# Patient Record
Sex: Male | Born: 1945 | Race: Black or African American | Hispanic: No | Marital: Married | Smoking: Former smoker
Health system: Southern US, Community
[De-identification: ages and names within clinical notes are randomized; demographics above are authoritative.]

## PROBLEM LIST (undated history)

## (undated) DIAGNOSIS — C801 Malignant (primary) neoplasm, unspecified: Secondary | ICD-10-CM

## (undated) DIAGNOSIS — C61 Malignant neoplasm of prostate: Secondary | ICD-10-CM

## (undated) DIAGNOSIS — N289 Disorder of kidney and ureter, unspecified: Secondary | ICD-10-CM

## (undated) DIAGNOSIS — I10 Essential (primary) hypertension: Secondary | ICD-10-CM

## (undated) DIAGNOSIS — E119 Type 2 diabetes mellitus without complications: Secondary | ICD-10-CM

## (undated) DIAGNOSIS — E785 Hyperlipidemia, unspecified: Secondary | ICD-10-CM

## (undated) DIAGNOSIS — E041 Nontoxic single thyroid nodule: Secondary | ICD-10-CM

## (undated) DIAGNOSIS — J449 Chronic obstructive pulmonary disease, unspecified: Secondary | ICD-10-CM

## (undated) HISTORY — PX: OTHER SURGICAL HISTORY: SHX169

---

## 2007-08-24 ENCOUNTER — Ambulatory Visit: Payer: Self-pay | Admitting: Gastroenterology

## 2009-09-10 ENCOUNTER — Ambulatory Visit: Payer: Self-pay | Admitting: Radiation Oncology

## 2009-09-22 ENCOUNTER — Ambulatory Visit: Payer: Self-pay | Admitting: General Practice

## 2009-09-28 ENCOUNTER — Ambulatory Visit: Payer: Self-pay | Admitting: Radiation Oncology

## 2009-10-10 ENCOUNTER — Ambulatory Visit: Payer: Self-pay | Admitting: Radiation Oncology

## 2009-11-18 ENCOUNTER — Ambulatory Visit: Payer: Self-pay | Admitting: Radiation Oncology

## 2009-11-27 ENCOUNTER — Ambulatory Visit: Payer: Self-pay | Admitting: Radiation Oncology

## 2009-12-09 ENCOUNTER — Ambulatory Visit: Payer: Self-pay | Admitting: Urology

## 2009-12-10 ENCOUNTER — Ambulatory Visit: Payer: Self-pay | Admitting: Radiation Oncology

## 2010-01-10 ENCOUNTER — Ambulatory Visit: Payer: Self-pay | Admitting: Radiation Oncology

## 2010-05-10 ENCOUNTER — Ambulatory Visit: Payer: Self-pay | Admitting: Radiation Oncology

## 2010-06-11 ENCOUNTER — Ambulatory Visit: Payer: Self-pay | Admitting: Radiation Oncology

## 2010-12-13 ENCOUNTER — Ambulatory Visit: Payer: Self-pay | Admitting: Radiation Oncology

## 2011-01-11 ENCOUNTER — Ambulatory Visit: Payer: Self-pay | Admitting: Radiation Oncology

## 2011-07-20 IMAGING — CT CT ABD-PELV W/ CM
1 of 3 series · 11 of 32 positions shown, 17 images · IV contrast (isovue)
Comparison: None

REASON FOR EXAM: bone scan positive for mets  advanced prostate CA pt
takes Metformin
COMMENTS:

PROCEDURE:     KCT - KCT ABDOMEN/PELVIS W  - September 30, 2009  [DATE]
RESULT:     History: Advanced prostate can't
TECHNIQUE: Multiple axial images of the abdomen and pelvis were performed
from the lung bases to the pubic symphysis, with p.o. contrast and with 100
ml of Isovue 370 intravenous contrast.

[Series 2: abd with 5.0 i40f · axial · 0.91mm/px · z∈[-994,-624]mm · 11 of 90 slices shown, 17 images]
[im 8/90  soft-tissue]
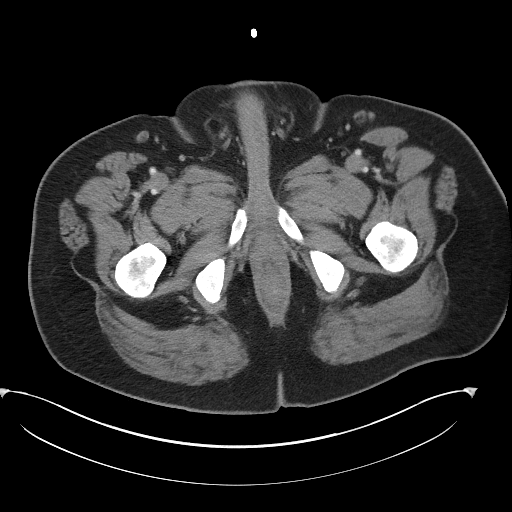
[im 8/90  bone]
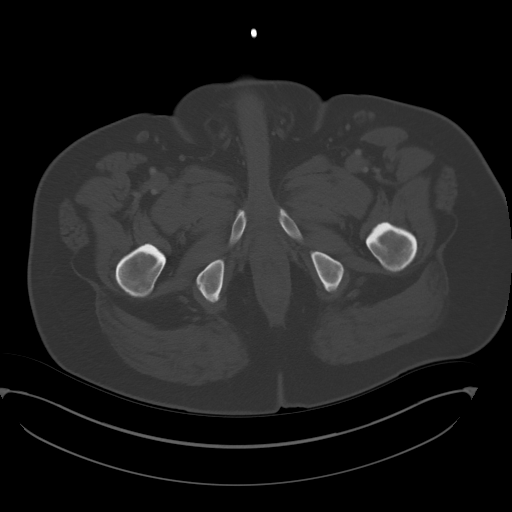
[im 15/90  soft-tissue]
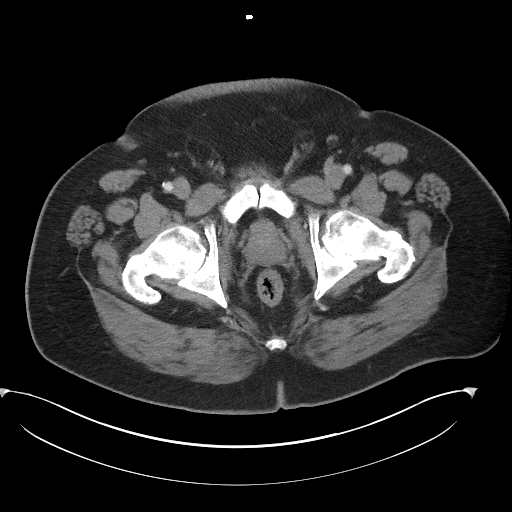
[im 23/90  soft-tissue]
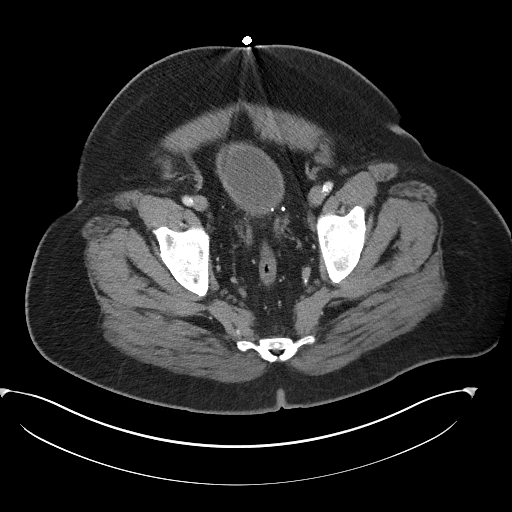
[im 30/90  soft-tissue]
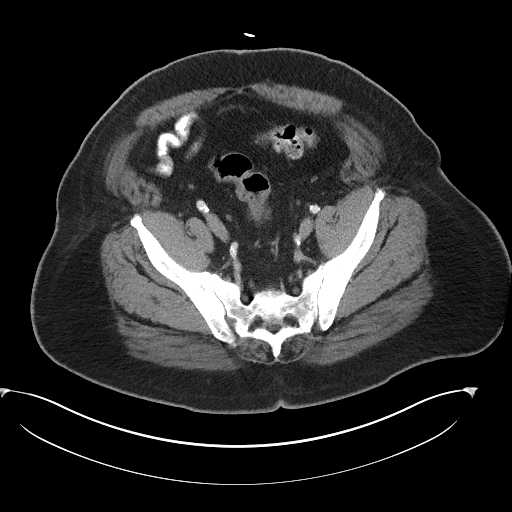
[im 38/90  soft-tissue]
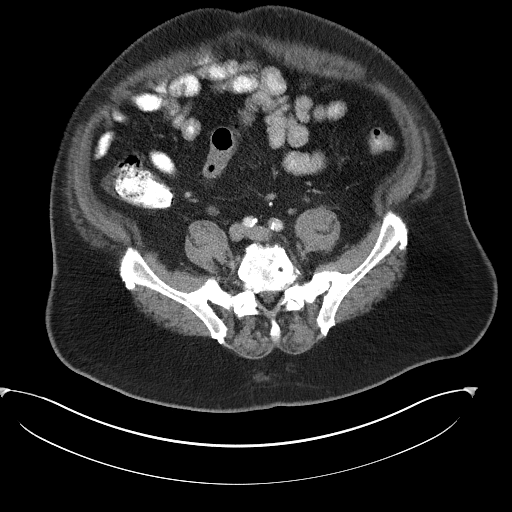
[im 45/90  soft-tissue]
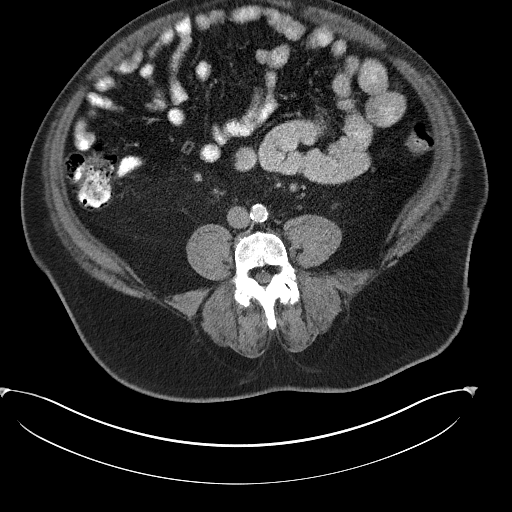
[im 52/90  soft-tissue]
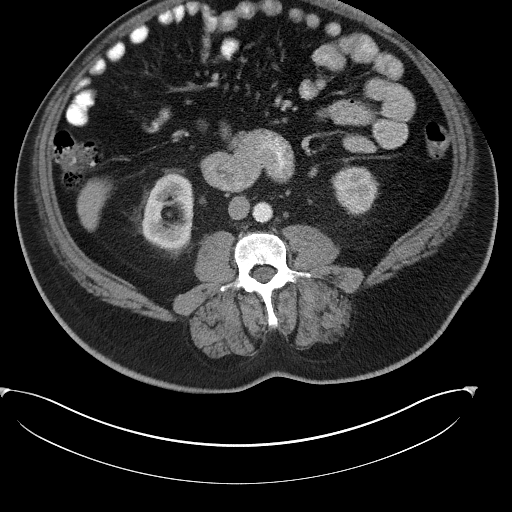
[im 60/90  soft-tissue]
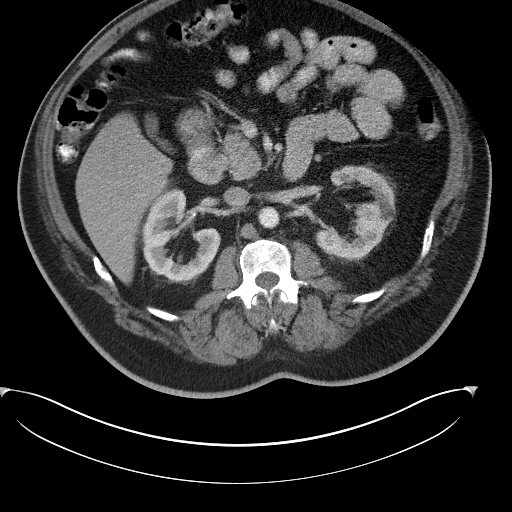
[im 60/90  lung]
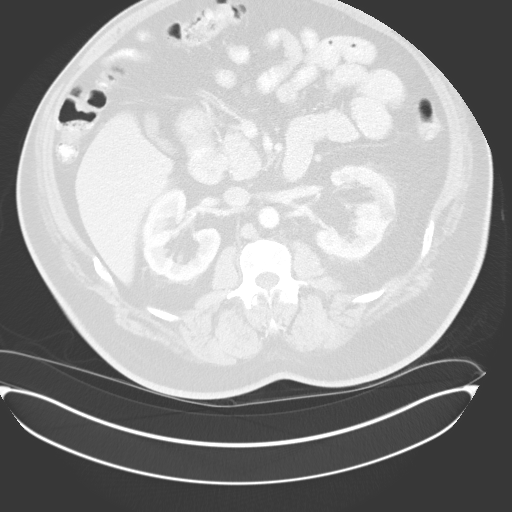
[im 67/90  soft-tissue]
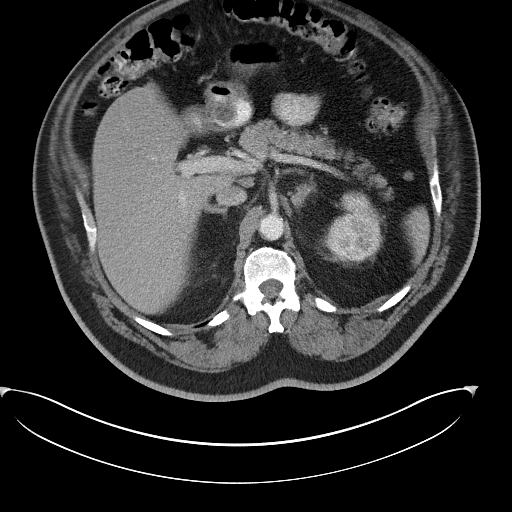
[im 67/90  lung]
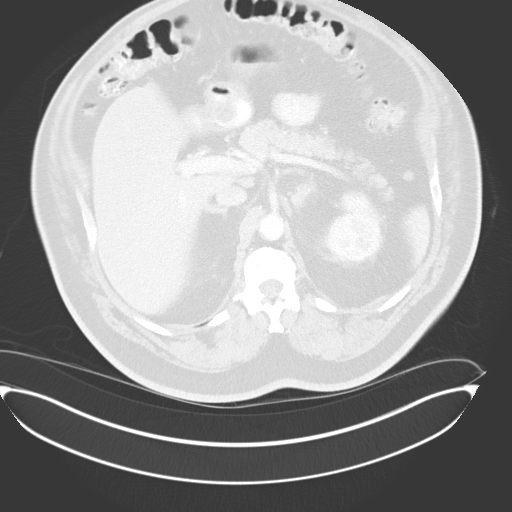
[im 67/90  bone]
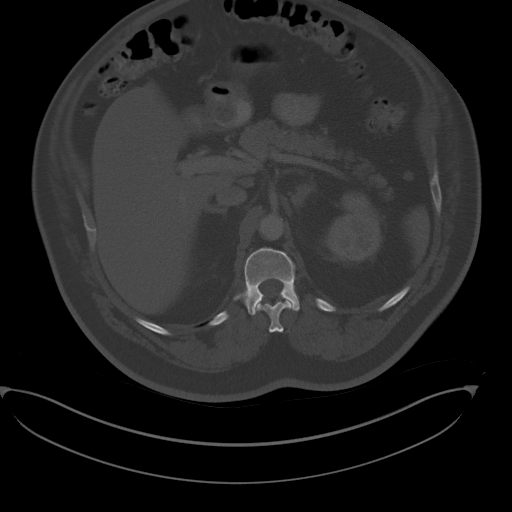
[im 75/90  soft-tissue]
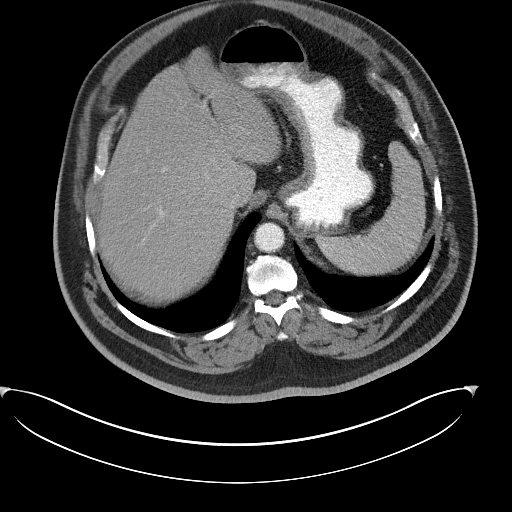
[im 75/90  lung]
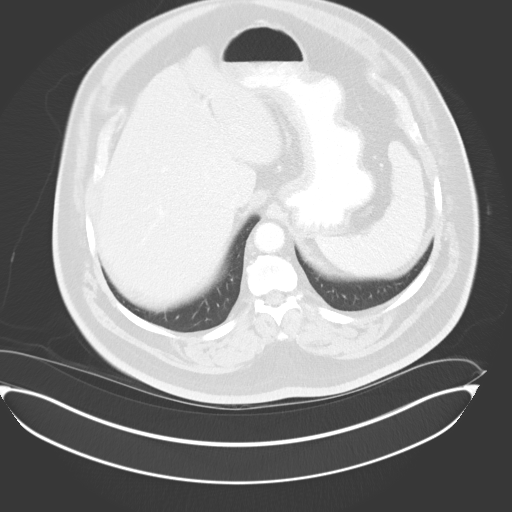
[im 82/90  soft-tissue]
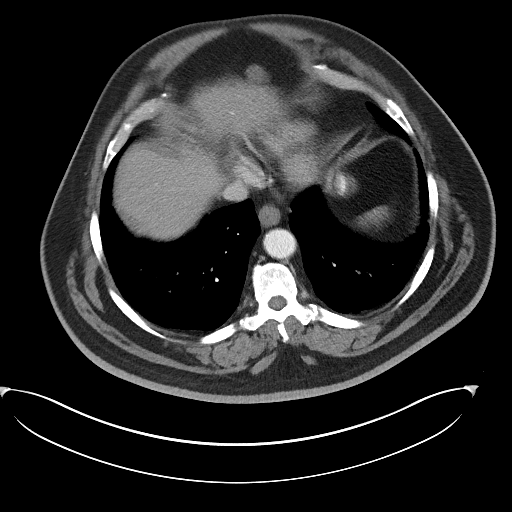
[im 82/90  lung]
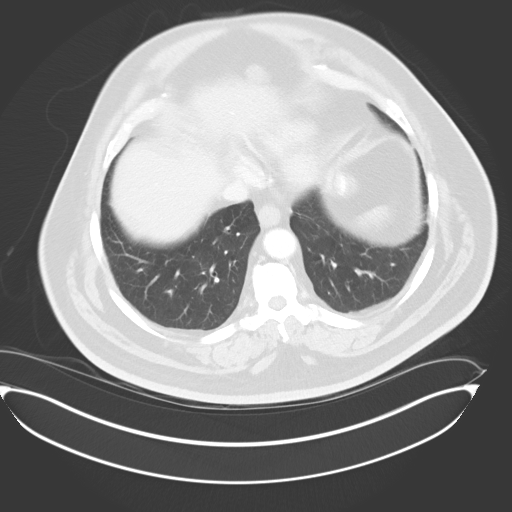

[11 of 32 positions shown; findings below may reference images not displayed]

FINDINGS: The lung bases are clear. There is no pneumothorax. The heart size is
normal.

The liver demonstrates no focal abnormality. There is no intrahepatic or
extrahepatic biliary ductal dilatation. The gallbladder is unremarkable. The
spleen demonstrates no focal abnormality. The kidneys, adrenal glands, and
pancreas are normal. The bladder is unremarkable.

The stomach, duodenum, small intestine, and large intestine demonstrate no
contrast extravasation or dilatation.  There is no pneumoperitoneum,
pneumatosis, or portal venous gas. There is no abdominal or pelvic free
fluid. There are bilateral common iliac artery lymph nodes which are
prominent in size with the largest on the right measuring 1.2 cm and the
largest on the left measuring 10 mm.

There is a heterogeneous appearance of the L5 vertebral body with areas of
lucency which may reflect severe degenerative disc disease and Modic
endplate changes versus metastatic disease. Further evaluation with MRI is
recommended. There is severe degenerative disc disease at L5-S1. There are
degenerative changes of bilateral SI joints.

The osseous structures are unremarkable.
IMPRESSION: There is a heterogeneous appearance of the L5 vertebral body with areas of
lucency which may reflect severe degenerative disc disease and Modic
endplate changes versus metastatic disease. Further evaluation with MRI is
recommended.

## 2011-12-23 ENCOUNTER — Ambulatory Visit: Payer: Self-pay | Admitting: Radiation Oncology

## 2012-01-11 ENCOUNTER — Ambulatory Visit: Payer: Self-pay | Admitting: Radiation Oncology

## 2012-10-01 ENCOUNTER — Ambulatory Visit: Payer: Self-pay | Admitting: Family Medicine

## 2012-11-05 ENCOUNTER — Ambulatory Visit: Payer: Self-pay | Admitting: Gastroenterology

## 2012-12-20 ENCOUNTER — Ambulatory Visit: Payer: Self-pay | Admitting: Radiation Oncology

## 2013-12-11 ENCOUNTER — Ambulatory Visit: Payer: Self-pay | Admitting: Family Medicine

## 2018-01-31 ENCOUNTER — Other Ambulatory Visit: Payer: Self-pay

## 2018-01-31 ENCOUNTER — Emergency Department: Payer: Medicare Other

## 2018-01-31 ENCOUNTER — Emergency Department
Admission: EM | Admit: 2018-01-31 | Discharge: 2018-01-31 | Disposition: A | Discharge status: Home / Self Care | Payer: Medicare Other | Attending: Emergency Medicine | Admitting: Emergency Medicine

## 2018-01-31 DIAGNOSIS — J189 Pneumonia, unspecified organism: Secondary | ICD-10-CM | POA: Diagnosis not present

## 2018-01-31 DIAGNOSIS — Z87891 Personal history of nicotine dependence: Secondary | ICD-10-CM | POA: Insufficient documentation

## 2018-01-31 DIAGNOSIS — Z8546 Personal history of malignant neoplasm of prostate: Secondary | ICD-10-CM | POA: Diagnosis not present

## 2018-01-31 DIAGNOSIS — Z7982 Long term (current) use of aspirin: Secondary | ICD-10-CM | POA: Insufficient documentation

## 2018-01-31 DIAGNOSIS — E119 Type 2 diabetes mellitus without complications: Secondary | ICD-10-CM | POA: Insufficient documentation

## 2018-01-31 DIAGNOSIS — I10 Essential (primary) hypertension: Secondary | ICD-10-CM | POA: Insufficient documentation

## 2018-01-31 DIAGNOSIS — Z79899 Other long term (current) drug therapy: Secondary | ICD-10-CM | POA: Insufficient documentation

## 2018-01-31 DIAGNOSIS — R0602 Shortness of breath: Secondary | ICD-10-CM | POA: Diagnosis present

## 2018-01-31 DIAGNOSIS — R600 Localized edema: Secondary | ICD-10-CM | POA: Diagnosis not present

## 2018-01-31 DIAGNOSIS — Z7984 Long term (current) use of oral hypoglycemic drugs: Secondary | ICD-10-CM | POA: Insufficient documentation

## 2018-01-31 DIAGNOSIS — J441 Chronic obstructive pulmonary disease with (acute) exacerbation: Secondary | ICD-10-CM

## 2018-01-31 HISTORY — DX: Malignant (primary) neoplasm, unspecified: C80.1

## 2018-01-31 HISTORY — DX: Essential (primary) hypertension: I10

## 2018-01-31 HISTORY — DX: Chronic obstructive pulmonary disease, unspecified: J44.9

## 2018-01-31 HISTORY — DX: Malignant neoplasm of prostate: C61

## 2018-01-31 HISTORY — DX: Type 2 diabetes mellitus without complications: E11.9

## 2018-01-31 LAB — BASIC METABOLIC PANEL
Anion gap: 9 (ref 5–15)
BUN: 17 mg/dL (ref 8–23)
CALCIUM: 8.9 mg/dL (ref 8.9–10.3)
CO2: 27 mmol/L (ref 22–32)
Chloride: 99 mmol/L (ref 98–111)
Creatinine, Ser: 1.51 mg/dL — ABNORMAL HIGH (ref 0.61–1.24)
GFR calc Af Amer: 53 mL/min — ABNORMAL LOW (ref >60)
GFR calc non Af Amer: 45 mL/min — ABNORMAL LOW (ref >60)
Glucose, Bld: 171 mg/dL — ABNORMAL HIGH (ref 70–99)
Potassium: 3.9 mmol/L (ref 3.5–5.1)
Sodium: 135 mmol/L (ref 135–145)

## 2018-01-31 LAB — CBC WITH DIFFERENTIAL/PLATELET
Abs Immature Granulocytes: 0.08 10*3/uL — ABNORMAL HIGH (ref 0.00–0.07)
Basophils Absolute: 0.1 10*3/uL (ref 0.0–0.1)
Basophils Relative: 1 %
Eosinophils Absolute: 0.4 10*3/uL (ref 0.0–0.5)
Eosinophils Relative: 3 %
HCT: 41.1 % (ref 39.0–52.0)
Hemoglobin: 12.3 g/dL — ABNORMAL LOW (ref 13.0–17.0)
Immature Granulocytes: 1 %
LYMPHS ABS: 2.1 10*3/uL (ref 0.7–4.0)
Lymphocytes Relative: 15 %
MCH: 25.3 pg — ABNORMAL LOW (ref 26.0–34.0)
MCHC: 29.9 g/dL — ABNORMAL LOW (ref 30.0–36.0)
MCV: 84.4 fL (ref 80.0–100.0)
Monocytes Absolute: 1.5 10*3/uL — ABNORMAL HIGH (ref 0.1–1.0)
Monocytes Relative: 10 %
NRBC: 0 % (ref 0.0–0.2)
Neutro Abs: 9.9 10*3/uL — ABNORMAL HIGH (ref 1.7–7.7)
Neutrophils Relative %: 70 %
Platelets: 272 10*3/uL (ref 150–400)
RBC: 4.87 MIL/uL (ref 4.22–5.81)
RDW: 15 % (ref 11.5–15.5)
WBC: 14 10*3/uL — ABNORMAL HIGH (ref 4.0–10.5)

## 2018-01-31 LAB — BRAIN NATRIURETIC PEPTIDE: B Natriuretic Peptide: 296 pg/mL — ABNORMAL HIGH (ref 0.0–100.0)

## 2018-01-31 LAB — TROPONIN I: Troponin I: <0.03 ng/mL (ref <0.03)

## 2018-01-31 MED ORDER — AZITHROMYCIN 500 MG PO TABS
500.0000 mg | ORAL_TABLET | Freq: Once | ORAL | Status: AC
  Administered 2018-01-31: 500 mg via ORAL
  Filled 2018-01-31: qty 1

## 2018-01-31 MED ORDER — PREDNISONE 20 MG PO TABS: 60.0000 mg | ORAL_TABLET | Freq: Every day | ORAL | 0 refills | Status: AC

## 2018-01-31 MED ORDER — METOPROLOL TARTRATE 50 MG PO TABS
50.0000 mg | ORAL_TABLET | Freq: Once | ORAL | Status: AC
  Administered 2018-01-31: 50 mg via ORAL
  Filled 2018-01-31: qty 1

## 2018-01-31 MED ORDER — IPRATROPIUM-ALBUTEROL 0.5-2.5 (3) MG/3ML IN SOLN
3.0000 mL | Freq: Once | RESPIRATORY_TRACT | Status: AC
  Administered 2018-01-31: 3 mL via RESPIRATORY_TRACT
  Filled 2018-01-31: qty 3

## 2018-01-31 MED ORDER — AZITHROMYCIN 250 MG PO TABS: ORAL_TABLET | ORAL | 0 refills | Status: DC

## 2018-01-31 MED ORDER — SODIUM CHLORIDE 0.9 % IV SOLN
1.0000 g | Freq: Once | INTRAVENOUS | Status: AC
  Administered 2018-01-31: 1 g via INTRAVENOUS
  Filled 2018-01-31: qty 10

## 2018-01-31 MED ORDER — ALBUTEROL SULFATE HFA 108 (90 BASE) MCG/ACT IN AERS: 2.0000 | INHALATION_SPRAY | Freq: Four times a day (QID) | RESPIRATORY_TRACT | 2 refills | Status: AC | PRN

## 2018-01-31 MED ORDER — CEFPODOXIME PROXETIL 200 MG PO TABS: 200.0000 mg | ORAL_TABLET | Freq: Two times a day (BID) | ORAL | 0 refills | Status: AC

## 2018-01-31 NOTE — ED Triage Notes (Signed)
As per ems patient called from side of road with c/o of sob. Patient with auditory wheezing upon ed arrival. Sating 70% upon ems arrival. Duo nebs x 2  And solumedrol 125 mg IVP given prior to ed arrival.

## 2018-01-31 NOTE — ED Provider Notes (Signed)
[facility] Emergency Department Provider Note  ____________________________________________  Time seen: Approximately 4:31 PM  I have reviewed the triage vital signs and the nursing notes.   HISTORY  Chief Complaint Shortness of Breath   HPI [name] is a 73 y.o. male history of remote prostate cancer, COPD, diabetes, hypertension who presents for evaluation of shortness of breath.  Patient reports worsening productive cough over the last week.  Cough is productive of cloudy white sputum.  Today his shortness of breath became worse.  While patient was driving his shortness of breath became severe.  He was wheezing.  He denies chest pain, fever or chills, hemoptysis, personal or family history of blood clots, recent travel immobilization, leg pain, or exogenous hormones.  He has had no vomiting or diarrhea.  Patient was found per EMS to be hypoxic with oxygen in the 70s.  After 2 DuoNeb's and Solu-Medrol patient arrives with no hypoxia and significantly provement on his work of breathing.   Past Medical History:  Diagnosis Date  . Cancer ([employer])   . COPD (chronic obstructive pulmonary disease) ([place])   . Diabetes mellitus without complication ([place])   . Hypertension   . Prostate CA ([place])     There are no active problems to display for this patient.   History reviewed. No pertinent surgical history.  Prior to Admission medications   Medication Sig Start Date End Date Taking? Authorizing Provider  amLODipine (NORVASC) 5 MG tablet Take 5 mg by mouth daily. 01/26/15  Yes [provider]  aspirin EC 81 MG tablet Take 81 mg by mouth daily.   Yes [provider]  atorvastatin (LIPITOR) 40 MG tablet Take 40 mg by mouth daily. 10/25/13 08/10/18 Yes [provider]  losartan (COZAAR) 100 MG tablet Take 100 mg by mouth daily. 08/10/17  Yes [provider]  metFORMIN (GLUCOPHAGE) 850 MG tablet Take 850 mg by mouth 2 (two) times  daily. 04/08/14  Yes [provider]  metoprolol tartrate (LOPRESSOR) 100 MG tablet Take 50 mg by mouth 2 (two) times daily. 01/26/15  Yes [provider]  sertraline (ZOLOFT) 100 MG tablet Take 100 mg by mouth daily. 12/13/17  Yes [provider]  sertraline (ZOLOFT) 50 MG tablet Take 50 mg by mouth daily. 10/04/16  Yes [provider]  tadalafil (CIALIS) 20 MG tablet Take 20 mg by mouth as needed. 05/08/15  Yes [provider]  traZODone (DESYREL) 100 MG tablet Take 100 mg by mouth at bedtime as needed for sleep. 12/13/17  Yes [provider]  albuterol (PROVENTIL HFA;VENTOLIN HFA) 108 (90 Base) MCG/ACT inhaler Inhale 2 puffs into the lungs every 6 (six) hours as needed for wheezing or shortness of breath. 01/31/18   Alfred Levins, [place], MD  azithromycin [facility]) 250 MG tablet Take 1 a day for 4 days 01/31/18   Alfred Levins, [place], MD  cefpodoxime (VANTIN) 200 MG tablet Take 1 tablet (200 mg total) by mouth 2 (two) times daily for 7 days. 01/31/18 02/07/18  Rudene Re, MD  predniSONE (DELTASONE) 20 MG tablet Take 3 tablets (60 mg total) by mouth daily for 4 days. 01/31/18 02/04/18  Rudene Re, MD  sildenafil (REVATIO) 20 MG tablet Take 100 mg by mouth as needed. 12/13/17   [provider]    Allergies Penicillins  History reviewed. No pertinent family history.  Social History Social History   Tobacco Use  . Smoking status: Former Smoker    Last attempt to quit: 02/01/1999  Years since quitting: 19.0  . Smokeless tobacco: Current User  Substance Use Topics  . Alcohol use: Not on file  . Drug use: Not on file    Review of Systems  Constitutional: Negative for fever. Eyes: Negative for visual changes. ENT: Negative for sore throat. Neck: No neck pain  Cardiovascular: Negative for chest pain. Respiratory: + shortness of breath and cough Gastrointestinal: Negative for abdominal pain, vomiting or  diarrhea. Genitourinary: Negative for dysuria. Musculoskeletal: Negative for back pain. Skin: Negative for rash. Neurological: Negative for headaches, weakness or numbness. Psych: No SI or HI  ____________________________________________   PHYSICAL EXAM:  VITAL SIGNS: ED Triage Vitals  Enc Vitals Group     BP 01/31/18 1622 (!) 170/115     Pulse Rate 01/31/18 1622 93     Resp --      Temp --      Temp Source 01/31/18 1622 Oral     SpO2 01/31/18 1622 100 %     Weight 01/31/18 1623 240 lb (108.9 kg)     Height 01/31/18 1623 5\' 6"  (1.676 m)     Head Circumference --      Peak Flow --      Pain Score 01/31/18 1622 0     Pain Loc --      Pain Edu? --      Excl. in [place]? --     Constitutional: Alert and oriented. Well appearing and in no apparent distress. HEENT:      Head: Normocephalic and atraumatic.         Eyes: Conjunctivae are normal. Sclera is non-icteric.       Mouth/Throat: Mucous membranes are moist.       Neck: Supple with no signs of meningismus. Cardiovascular: Regular rate and rhythm. No murmurs, gallops, or rubs. 2+ symmetrical distal pulses are present in all extremities. No JVD. Respiratory: Mild increased work of breathing, diffuse coarse rhonchi bilaterally and expiratory wheezes  Gastrointestinal: Soft, non tender, and non distended with positive bowel sounds. No rebound or guarding. Musculoskeletal: Trace edema on the RLE and no pitting edema on the LLE Neurologic: Normal speech and language. Face is symmetric. Moving all extremities. No gross focal neurologic deficits are appreciated. Skin: Skin is warm, dry and intact. No rash noted. Psychiatric: Mood and affect are normal. Speech and behavior are normal.  ____________________________________________   LABS (all labs ordered are listed, but only abnormal results are displayed)  Labs Reviewed  CBC WITH DIFFERENTIAL/PLATELET - Abnormal; Notable for the following components:      Result Value   WBC  14.0 (*)    Hemoglobin 12.3 (*)    MCH 25.3 (*)    MCHC 29.9 (*)    Neutro Abs 9.9 (*)    Monocytes Absolute 1.5 (*)    Abs Immature Granulocytes 0.08 (*)    All other components within normal limits  BASIC METABOLIC PANEL - Abnormal; Notable for the following components:   Glucose, Bld 171 (*)    Creatinine, Ser 1.51 (*)    GFR calc non Af Amer 45 (*)    GFR calc Af Amer 53 (*)    All other components within normal limits  BRAIN NATRIURETIC PEPTIDE - Abnormal; Notable for the following components:   B Natriuretic Peptide 296.0 (*)    All other components within normal limits  TROPONIN I   ____________________________________________  EKG  ED ECG REPORT I, Rudene Re, the attending physician, personally viewed and interpreted this ECG.  Normal  sinus rhythm, rate of 93, normal intervals, normal axis, no ST elevations or depressions.  Normal EKG. ____________________________________________  RADIOLOGY  I have personally reviewed the images performed during this visit and I agree with the Radiologist's read.   Interpretation by Radiologist:  Dg Chest 2 View  Result Date: 01/31/2018 CLINICAL DATA:  Cough and shortness of breath. Wheezing. EXAM: CHEST - 2 VIEW COMPARISON:  None. FINDINGS: Low lung volumes. Mild bibasilar atelectasis. Airspace opacity is seen in the inferior right upper lobe, suspicious for pneumonia. No evidence of pleural effusion. IMPRESSION: 1. Right upper lobe airspace opacity, suspicious for pneumonia. Recommend clinical correlation, and followup PA and lateral chest X-ray in several weeks to ensure resolution. 2. Low lung volumes with mild bibasilar atelectasis. Electronically Signed   By: Earle Gell M.D.   On: 01/31/2018 18:19   US Venous Img Lower Bilateral  Result Date: 01/31/2018 CLINICAL DATA:  Asymmetric leg swelling EXAM: BILATERAL LOWER EXTREMITY VENOUS DOPPLER ULTRASOUND TECHNIQUE: Gray-scale sonography with graded compression, as well as  color Doppler and duplex ultrasound were performed to evaluate the lower extremity deep venous systems from the level of the common femoral vein and including the common femoral, femoral, profunda femoral, popliteal and calf veins including the posterior tibial, peroneal and gastrocnemius veins when visible. The superficial great saphenous vein was also interrogated. Spectral Doppler was utilized to evaluate flow at rest and with distal augmentation maneuvers in the common femoral, femoral and popliteal veins. COMPARISON:  None. FINDINGS: RIGHT LOWER EXTREMITY Common Femoral Vein: No evidence of thrombus. Normal compressibility, respiratory phasicity and response to augmentation. Saphenofemoral Junction: No evidence of thrombus. Normal compressibility and flow on color Doppler imaging. Profunda Femoral Vein: No evidence of thrombus. Normal compressibility and flow on color Doppler imaging. Femoral Vein: No evidence of thrombus. Normal compressibility, respiratory phasicity and response to augmentation. Popliteal Vein: No evidence of thrombus. Normal compressibility, respiratory phasicity and response to augmentation. Calf Veins: Limited assessment of the calf veins. Posterior tibial vein appears patent and compressible. Peroneal vein not visualized. Superficial Great Saphenous Vein: No evidence of thrombus. Normal compressibility. Venous Reflux:  None. Other Findings:  None. LEFT LOWER EXTREMITY Common Femoral Vein: No evidence of thrombus. Normal compressibility, respiratory phasicity and response to augmentation. Saphenofemoral Junction: No evidence of thrombus. Normal compressibility and flow on color Doppler imaging. Profunda Femoral Vein: No evidence of thrombus. Normal compressibility and flow on color Doppler imaging. Femoral Vein: No evidence of thrombus. Normal compressibility, respiratory phasicity and response to augmentation. Popliteal Vein: No evidence of thrombus. Normal compressibility, respiratory  phasicity and response to augmentation. Calf Veins: Similar limited assessment of the calf veins. Posterior tibial vein appears patent and compressible. Peroneal vein not visualized. Superficial Great Saphenous Vein: No evidence of thrombus. Normal compressibility. Venous Reflux:  None. Other Findings:  None. IMPRESSION: No evidence of significant deep venous thrombosis in either extremity. Limited assessment of the calf veins as above. Electronically Signed   By: Jerilynn Mages.  Shick M.D.   On: 01/31/2018 18:14      ____________________________________________   PROCEDURES  Procedure(s) performed: None Procedures Critical Care performed:  None ____________________________________________   INITIAL IMPRESSION / ASSESSMENT AND PLAN / ED COURSE  73 y.o. male history of remote prostate cancer, COPD, diabetes, hypertension who presents for evaluation of productive cough and progressively worsening shortness of breath x 1 week which became severe while driving prior to arrival. Patient hypoxic to EMS.  After 2 DuoNeb's in route patient arrives with mildly increased work of breathing but  normal sats, no oxygen requirement, still having diffuse coarse rhonchi and wheezing bilaterally.  Patient has slightly asymmetric lower extremity with right bigger than left.  Patient reports never noticing this before.  He has never had a history of blood clots however we will send patient for ultrasound Doppler to rule out a DVT.  We will do a chest x-ray to rule out pulmonary edema or pneumonia.  Will do duo nebs for COPD exacerbation.  Patient has received Solu-Medrol per EMS.  Will check labs, EKG.    _________________________ 7:22 PM on 01/31/2018 -----------------------------------------  Chest x-ray concerning for pneumonia.  Patient with a white count of 14 but no other signs of sepsis.  Doppler studies negative for DVT.  After 3 DuoNeb's and steroids patient is moving good air with resolution of coarse rhonchi and  wheezing.  Patient was able to ambulate and maintain his sats at 95% on room air.  Discussed admission for IV antibiotics and observation for 24 hours but patient prefers to go home.  At this time I believe it is reasonable for him to go home.  He received a dose of IV Rocephin and p.o. azithromycin.  We will send him home on Cefpodoxime and azithromycin, prednisone, and prescription for albuterol inhaler.  Discussed close follow-up with primary care doctor and standard return precautions with patient and his wife.   As part of my medical decision making, I reviewed the following data within the [place] notes reviewed and incorporated, Labs reviewed , EKG interpreted , Old EKG reviewed, Old chart reviewed, Radiograph reviewed , Notes from prior ED visits and [place] Controlled Substance Database    Pertinent labs & imaging results that were available during my care of the patient were reviewed by me and considered in my medical decision making (see chart for details).    ____________________________________________   FINAL CLINICAL IMPRESSION(S) / ED DIAGNOSES  Final diagnoses:  COPD exacerbation ([place])  Community acquired pneumonia, unspecified laterality      NEW MEDICATIONS STARTED DURING THIS VISIT:  ED Discharge Orders         Ordered    cefpodoxime (VANTIN) 200 MG tablet  2 times daily     01/31/18 1921    azithromycin (ZITHROMAX) 250 MG tablet     01/31/18 1921    predniSONE (DELTASONE) 20 MG tablet  Daily     01/31/18 1921    albuterol (PROVENTIL HFA;VENTOLIN HFA) 108 (90 Base) MCG/ACT inhaler  Every 6 hours PRN     01/31/18 1921           Note:  This document was prepared using Dragon voice recognition software and may include unintentional dictation errors.    Rudene Re, MD 01/31/18 [phone]

## 2018-01-31 NOTE — Discharge Instructions (Addendum)

## 2018-01-31 NOTE — ED Notes (Signed)
Patient ambulated with without 02, desated down to 94%-95% on room air.

## 2018-04-05 ENCOUNTER — Encounter: Admission: RE | Payer: Self-pay | Source: Home / Self Care

## 2018-04-05 ENCOUNTER — Ambulatory Visit: Admission: RE | Admit: 2018-04-05 | Payer: Medicare Other | Source: Home / Self Care | Admitting: Gastroenterology

## 2018-04-05 SURGERY — COLONOSCOPY WITH PROPOFOL
Anesthesia: General

## 2018-05-08 ENCOUNTER — Other Ambulatory Visit: Payer: Self-pay

## 2018-05-08 ENCOUNTER — Telehealth: Payer: Medicare Other | Admitting: Urology

## 2018-06-20 ENCOUNTER — Other Ambulatory Visit: Payer: Self-pay

## 2018-06-20 ENCOUNTER — Encounter: Payer: Self-pay | Admitting: Urology

## 2018-06-20 ENCOUNTER — Ambulatory Visit (INDEPENDENT_AMBULATORY_CARE_PROVIDER_SITE_OTHER): Payer: Medicare Other | Admitting: Urology

## 2018-06-20 VITALS — BP 162/98 | HR 98 | Ht 66.0 in | Wt 265.0 lb

## 2018-06-20 DIAGNOSIS — C61 Malignant neoplasm of prostate: Secondary | ICD-10-CM | POA: Diagnosis not present

## 2018-06-20 DIAGNOSIS — N5203 Combined arterial insufficiency and corporo-venous occlusive erectile dysfunction: Secondary | ICD-10-CM

## 2018-06-20 DIAGNOSIS — R972 Elevated prostate specific antigen [PSA]: Secondary | ICD-10-CM | POA: Diagnosis not present

## 2018-06-20 NOTE — Progress Notes (Signed)
06/20/2018 10:02 AM   Cherlynn June 1945/07/26 [id number]  Referring provider: Juluis Pitch, MD [phone], [place] [address]  Chief Complaint  Patient presents with  . Elevated PSA    HPI: 73 year old male with a personal history of prostate cancer who presents today for rising PSA.  He was referred by his primary care physician.  Notably, the patient was diagnosed with prostate cancer, Gleason 4+3, T2 in 2011.  He was treated with brachytherapy in November 2011.  His nadir appears to be 0.11.  He also has a history of microscopic hematuria.  He was seen and evaluated in 2017 by Dr. Bernardo Heater.  Renal ultrasound showed bilateral renal cysts but was otherwise unremarkable.  Most recent UA on 06/2017 negative for RBC.    He also has ED and uses sidliafil as needed.  No side effects.    He denies any significant urinary issues.    His PSA trend is as below.  PSA: 0.11 04/2014 0.28 03/2015  0.51 05/2016 0.96 07/2017 1.48 01/2018   PMH: Past Medical History:  Diagnosis Date  . Cancer ([place])   . COPD (chronic obstructive pulmonary disease) ([place])   . Diabetes mellitus without complication ([place])   . Hypertension   . Prostate CA [facility])     Surgical History: No past surgical history on file.  Home Medications:  Allergies as of 06/20/2018      Reactions   Penicillins Swelling   Eggs Or Egg-derived Products Other (See Comments)      Medication List       Accurate as of June 20, 2018 10:02 AM. If you have any questions, ask your nurse or doctor.        STOP taking these medications   azithromycin 250 MG tablet Commonly known as:  ZITHROMAX Stopped by:  [name] Espy, MD     TAKE these medications   albuterol 108 (90 Base) MCG/ACT inhaler Commonly known as:  VENTOLIN HFA Inhale 2 puffs into the lungs every 6 (six) hours as needed for wheezing or shortness of breath.   amLODipine 5 MG tablet Commonly known as:  NORVASC Take 5 mg by mouth daily.    aspirin EC 81 MG tablet Take 81 mg by mouth daily.   atorvastatin 40 MG tablet Commonly known as:  LIPITOR Take 40 mg by mouth daily.   Cialis 20 MG tablet Generic drug:  tadalafil Take 20 mg by mouth as needed.   losartan 100 MG tablet Commonly known as:  COZAAR Take 100 mg by mouth daily.   metFORMIN 850 MG tablet Commonly known as:  GLUCOPHAGE Take 850 mg by mouth 2 (two) times daily.   metoprolol tartrate 100 MG tablet Commonly known as:  LOPRESSOR Take 50 mg by mouth 2 (two) times daily.   sertraline 50 MG tablet Commonly known as:  ZOLOFT Take 50 mg by mouth daily.   sertraline 100 MG tablet Commonly known as:  ZOLOFT Take 100 mg by mouth daily.   sildenafil 20 MG tablet Commonly known as:  REVATIO Take 100 mg by mouth as needed.   traZODone 100 MG tablet Commonly known as:  DESYREL Take 100 mg by mouth at bedtime as needed for sleep.       Allergies:  Allergies  Allergen Reactions  . Penicillins Swelling  . Eggs Or Egg-Derived Products Other (See Comments)    Family History: No family history on file.  Social History:  reports that he quit smoking about 43  years ago. He uses smokeless tobacco. No history on file for alcohol and drug.  ROS: UROLOGY Frequent Urination?: No Hard to postpone urination?: No Burning/pain with urination?: No Get up at night to urinate?: No Leakage of urine?: No Urine stream starts and stops?: No Trouble starting stream?: No Do you have to strain to urinate?: No Blood in urine?: No Urinary tract infection?: No Sexually transmitted disease?: No Injury to kidneys or bladder?: No Painful intercourse?: No Weak stream?: No Erection problems?: No Penile pain?: No  Gastrointestinal Nausea?: No Vomiting?: No Indigestion/heartburn?: No Diarrhea?: No Constipation?: No  Constitutional Fever: No Night sweats?: No Weight loss?: No Fatigue?: No  Skin Skin rash/lesions?: No Itching?: No  Eyes Blurred  vision?: No Double vision?: No  Ears/Nose/Throat Sore throat?: No Sinus problems?: No  Hematologic/Lymphatic Swollen glands?: No Easy bruising?: No  Cardiovascular Leg swelling?: No Chest pain?: No  Respiratory Cough?: No Shortness of breath?: No  Endocrine Excessive thirst?: No  Musculoskeletal Back pain?: No Joint pain?: No  Neurological Headaches?: No Dizziness?: No  Psychologic Depression?: No Anxiety?: No  Physical Exam: BP (!) 162/98   Pulse 98   Ht 5\' 6"  (1.676 m)   Wt 265 lb (120.2 kg)   BMI 42.77 kg/m   Constitutional:  Alert and oriented, No acute distress. HEENT: [place] AT, moist mucus membranes.  Trachea midline, no masses. Cardiovascular: No clubbing, cyanosis, or edema. Respiratory: Normal respiratory effort, no increased work of breathing. GI: Abdomen is soft, obese Skin: No rashes, bruises or suspicious lesions. Neurologic: Grossly intact, no focal deficits, moving all 4 extremities. Psychiatric: Normal mood and affect.  Laboratory Data: Lab Results  Component Value Date   WBC 14.0 (H) 01/31/2018   HGB 12.3 (L) 01/31/2018   HCT 41.1 01/31/2018   MCV 84.4 01/31/2018   PLT 272 01/31/2018    Lab Results  Component Value Date   CREATININE 1.51 (H) 01/31/2018   PSA as above  Urinalysis N/a  Pertinent Imaging: n/a  Assessment & Plan:    1. Prostate cancer [facility]) Personal history of unfavorable intermediate risk prostate cancer status post seed Implantation in 2011 PSA has been slowly rising We discussed that criteria for biochemical recurrence with radiation is when the PSA rises 2 ng/mL above the nadir, has yet to reach this criteria Plan for repeat PSA today If his PSA is above 2.11, will plan for restaging imaging and discussion of various treatment options If his PSA is below this threshold, we will plan to follow him very closely with PSA prior He is otherwise asymptomatic He is agreeable the above plan - PSA - PSA; Future   2. 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  [name] Espy, MD  [facility] [address] [place], [place] [address] [phone]

## 2018-06-21 ENCOUNTER — Telehealth: Payer: Self-pay

## 2018-06-21 LAB — PSA: Prostate Specific Ag, Serum: 1.4 ng/mL (ref 0.0–4.0)

## 2018-06-21 NOTE — Telephone Encounter (Signed)
-----   Message from [name] Espy, MD sent at 06/21/2018  8:21 AM EDT ----- PSA is stable at 1.4.  Lets follow up in 6 months with PSA prior as discussed.    [name] Espy, MD

## 2018-06-21 NOTE — Telephone Encounter (Signed)
Patient notified

## 2018-08-13 ENCOUNTER — Other Ambulatory Visit: Payer: Self-pay

## 2018-08-13 ENCOUNTER — Other Ambulatory Visit
Admission: RE | Admit: 2018-08-13 | Discharge: 2018-08-13 | Disposition: A | Discharge status: Home / Self Care | Payer: Medicare Other | Source: Ambulatory Visit | Attending: Gastroenterology | Admitting: Gastroenterology

## 2018-08-13 DIAGNOSIS — Z20828 Contact with and (suspected) exposure to other viral communicable diseases: Secondary | ICD-10-CM | POA: Insufficient documentation

## 2018-08-13 DIAGNOSIS — Z01812 Encounter for preprocedural laboratory examination: Secondary | ICD-10-CM | POA: Insufficient documentation

## 2018-08-13 LAB — SARS CORONAVIRUS 2 (TAT 6-24 HRS): SARS Coronavirus 2: NEGATIVE

## 2018-08-15 ENCOUNTER — Encounter: Payer: Self-pay | Admitting: *Deleted

## 2018-08-16 ENCOUNTER — Encounter: Payer: Self-pay | Admitting: *Deleted

## 2018-08-16 ENCOUNTER — Ambulatory Visit: Payer: Medicare Other | Admitting: Anesthesiology

## 2018-08-16 ENCOUNTER — Other Ambulatory Visit: Payer: Self-pay

## 2018-08-16 ENCOUNTER — Encounter
Admission: RE | Disposition: A | Discharge status: Home / Self Care | Payer: Self-pay | Source: Home / Self Care | Attending: Gastroenterology

## 2018-08-16 ENCOUNTER — Ambulatory Visit
Admission: RE | Admit: 2018-08-16 | Discharge: 2018-08-16 | Disposition: A | Discharge status: Home / Self Care | Payer: Medicare Other | Attending: Gastroenterology | Admitting: Gastroenterology

## 2018-08-16 DIAGNOSIS — K640 First degree hemorrhoids: Secondary | ICD-10-CM | POA: Insufficient documentation

## 2018-08-16 DIAGNOSIS — D124 Benign neoplasm of descending colon: Secondary | ICD-10-CM | POA: Insufficient documentation

## 2018-08-16 DIAGNOSIS — E119 Type 2 diabetes mellitus without complications: Secondary | ICD-10-CM | POA: Diagnosis not present

## 2018-08-16 DIAGNOSIS — Z86010 Personal history of colonic polyps: Secondary | ICD-10-CM | POA: Diagnosis not present

## 2018-08-16 DIAGNOSIS — Z7982 Long term (current) use of aspirin: Secondary | ICD-10-CM | POA: Insufficient documentation

## 2018-08-16 DIAGNOSIS — J449 Chronic obstructive pulmonary disease, unspecified: Secondary | ICD-10-CM | POA: Diagnosis not present

## 2018-08-16 DIAGNOSIS — Z8546 Personal history of malignant neoplasm of prostate: Secondary | ICD-10-CM | POA: Diagnosis not present

## 2018-08-16 DIAGNOSIS — K5730 Diverticulosis of large intestine without perforation or abscess without bleeding: Secondary | ICD-10-CM | POA: Insufficient documentation

## 2018-08-16 DIAGNOSIS — N289 Disorder of kidney and ureter, unspecified: Secondary | ICD-10-CM | POA: Diagnosis not present

## 2018-08-16 DIAGNOSIS — I10 Essential (primary) hypertension: Secondary | ICD-10-CM | POA: Insufficient documentation

## 2018-08-16 DIAGNOSIS — Z7984 Long term (current) use of oral hypoglycemic drugs: Secondary | ICD-10-CM | POA: Insufficient documentation

## 2018-08-16 DIAGNOSIS — Z79899 Other long term (current) drug therapy: Secondary | ICD-10-CM | POA: Diagnosis not present

## 2018-08-16 DIAGNOSIS — Z87891 Personal history of nicotine dependence: Secondary | ICD-10-CM | POA: Diagnosis not present

## 2018-08-16 DIAGNOSIS — E785 Hyperlipidemia, unspecified: Secondary | ICD-10-CM | POA: Insufficient documentation

## 2018-08-16 DIAGNOSIS — D128 Benign neoplasm of rectum: Secondary | ICD-10-CM | POA: Diagnosis not present

## 2018-08-16 HISTORY — PX: COLONOSCOPY WITH PROPOFOL: SHX5780

## 2018-08-16 HISTORY — DX: Nontoxic single thyroid nodule: E04.1

## 2018-08-16 HISTORY — DX: Hyperlipidemia, unspecified: E78.5

## 2018-08-16 HISTORY — DX: Disorder of kidney and ureter, unspecified: N28.9

## 2018-08-16 LAB — GLUCOSE, CAPILLARY: Glucose-Capillary: 143 mg/dL — ABNORMAL HIGH (ref 70–99)

## 2018-08-16 SURGERY — COLONOSCOPY WITH PROPOFOL
Anesthesia: General

## 2018-08-16 MED ORDER — SODIUM CHLORIDE 0.9 % IV SOLN
INTRAVENOUS | Status: DC
  Administered 2018-08-16 (×2): via INTRAVENOUS

## 2018-08-16 MED ORDER — PROPOFOL 500 MG/50ML IV EMUL
INTRAVENOUS | Status: DC | PRN
  Administered 2018-08-16: 150 ug/kg/min via INTRAVENOUS

## 2018-08-16 MED ORDER — EPHEDRINE SULFATE 50 MG/ML IJ SOLN
INTRAMUSCULAR | Status: AC
  Filled 2018-08-16: qty 1

## 2018-08-16 MED ORDER — LIDOCAINE HCL (PF) 2 % IJ SOLN
INTRAMUSCULAR | Status: AC
  Filled 2018-08-16: qty 10

## 2018-08-16 MED ORDER — PROPOFOL 500 MG/50ML IV EMUL
INTRAVENOUS | Status: AC
  Filled 2018-08-16: qty 50

## 2018-08-16 MED ORDER — FENTANYL CITRATE (PF) 100 MCG/2ML IJ SOLN
INTRAMUSCULAR | Status: AC
  Filled 2018-08-16: qty 2

## 2018-08-16 MED ORDER — PROPOFOL 10 MG/ML IV BOLUS
INTRAVENOUS | Status: DC | PRN
  Administered 2018-08-16: 40 mg via INTRAVENOUS

## 2018-08-16 MED ORDER — FENTANYL CITRATE (PF) 100 MCG/2ML IJ SOLN
INTRAMUSCULAR | Status: DC | PRN
  Administered 2018-08-16: 50 ug via INTRAVENOUS

## 2018-08-16 NOTE — Transfer of Care (Signed)
Immediate Anesthesia Transfer of Care Note  Patient: TYGE SOMERS  Procedure(s) Performed: COLONOSCOPY WITH PROPOFOL (N/A )  Patient Location: PACU  Anesthesia Type:General  Level of Consciousness: sedated  Airway & Oxygen Therapy: Patient Spontanous Breathing and Patient connected to nasal cannula oxygen  Post-op Assessment: Report given to RN and Post -op Vital signs reviewed and stable  Post vital signs: Reviewed and stable  Last Vitals:  Vitals Value Taken Time  BP 128/73 08/16/18 0938  Temp 36.1 C 08/16/18 0936  Pulse 73 08/16/18 0938  Resp 18 08/16/18 0938  SpO2 93 % 08/16/18 0938  Vitals shown include unvalidated device data.  Last Pain:  Vitals:   08/16/18 0936  TempSrc: Tympanic  PainSc: Asleep         Complications: No apparent anesthesia complications

## 2018-08-16 NOTE — Anesthesia Preprocedure Evaluation (Addendum)
Anesthesia Evaluation  Patient identified by MRN, date of birth, ID band Patient awake    Reviewed: Allergy & Precautions, NPO status , Patient's Chart, lab work & pertinent test results  Airway Mallampati: III  TM Distance: >3 FB     Dental  (+) Upper Dentures, Lower Dentures   Pulmonary COPD, former smoker,    Pulmonary exam normal        Cardiovascular hypertension, Normal cardiovascular exam     Neuro/Psych negative neurological ROS  negative psych ROS   GI/Hepatic   Endo/Other  diabetes  Renal/GU Renal InsufficiencyRenal disease  negative genitourinary   Musculoskeletal negative musculoskeletal ROS (+)   Abdominal Normal abdominal exam  (+)   Peds negative pediatric ROS (+)  Hematology negative hematology ROS (+)   Anesthesia Other Findings   Reproductive/Obstetrics                             Anesthesia Physical Anesthesia Plan  ASA: III  Anesthesia Plan: General   Post-op Pain Management:    Induction: Intravenous  PONV Risk Score and Plan:   Airway Management Planned: Nasal Cannula  Additional Equipment:   Intra-op Plan:   Post-operative Plan:   Informed Consent: I have reviewed the patients History and Physical, chart, labs and discussed the procedure including the risks, benefits and alternatives for the proposed anesthesia with the patient or authorized representative who has indicated his/her understanding and acceptance.     Dental advisory given  Plan Discussed with: CRNA and Surgeon  Anesthesia Plan Comments: (Patient is able to eat cakes and other with egg products in them without problem.)       Anesthesia Quick Evaluation

## 2018-08-16 NOTE — Anesthesia Procedure Notes (Signed)
Date/Time: 08/16/2018 8:55 AM Performed by: Allean Found, CRNA Pre-anesthesia Checklist: Patient identified, Emergency Drugs available, Suction available, Patient being monitored and Timeout performed Oxygen Delivery Method: Nasal cannula Placement Confirmation: positive ETCO2

## 2018-08-16 NOTE — H&P (Signed)
Outpatient short stay form Pre-procedure 08/16/2018 8:44 AM Justin Sails MD  Primary Physician: Dr. Juluis Pitch  Reason for visit: Colonoscopy  History of present illness: Patient is a 73 year old male presenting today for colonoscopy in regards to his personal history of adenomatous colon polyps.  His last colonoscopy was 11/05/2012.  He did experience some anemia several months ago however was Hemoccult negative at that time.  He tolerated his prep well.  He takes a daily 81 mg aspirin.  He takes no other blood thinning agent or aspirin product.    Current Facility-Administered Medications:  .  0.9 %  sodium chloride infusion, , Intravenous, Continuous, Justin Sails, MD, Last Rate: 20 mL/hr at 08/16/18 0755  Medications Prior to Admission  Medication Sig Dispense Refill Last Dose  . albuterol (PROVENTIL HFA;VENTOLIN HFA) 108 (90 Base) MCG/ACT inhaler Inhale 2 puffs into the lungs every 6 (six) hours as needed for wheezing or shortness of breath. 1 Inhaler 2 Past Week at Unknown time  . amLODipine (NORVASC) 5 MG tablet Take 5 mg by mouth daily.   08/15/2018 at 0900  . aspirin EC 81 MG tablet Take 81 mg by mouth daily.   08/15/2018 at 0900  . losartan (COZAAR) 100 MG tablet Take 100 mg by mouth daily.   08/15/2018 at 0900  . metFORMIN (GLUCOPHAGE) 850 MG tablet Take 850 mg by mouth 2 (two) times daily.   08/15/2018 at 0900  . metoprolol tartrate (LOPRESSOR) 100 MG tablet Take 50 mg by mouth 2 (two) times daily.   08/15/2018 at 0900  . sertraline (ZOLOFT) 100 MG tablet Take 100 mg by mouth daily.   08/15/2018 at 0900  . sertraline (ZOLOFT) 50 MG tablet Take 50 mg by mouth daily.   08/15/2018 at 0900  . sildenafil (REVATIO) 20 MG tablet Take 100 mg by mouth as needed.   08/15/2018 at 0900  . tadalafil (CIALIS) 20 MG tablet Take 20 mg by mouth as needed.   Past Week at Unknown time  . traZODone (DESYREL) 100 MG tablet Take 100 mg by mouth at bedtime as needed for sleep.   08/15/2018 at 2000  .  atorvastatin (LIPITOR) 40 MG tablet Take 40 mg by mouth daily.        Allergies  Allergen Reactions  . Penicillins Swelling  . Eggs Or Egg-Derived Products Other (See Comments)     Past Medical History:  Diagnosis Date  . Cancer ([place])   . COPD (chronic obstructive pulmonary disease) ([place])   . Diabetes mellitus without complication ([place])   . Hyperlipidemia   . Hypertension   . Left thyroid nodule   . Prostate CA ([place])   . Renal insufficiency     Review of systems:      Physical Exam    Heart and lungs: Regular rate and rhythm without rub or gallop lungs are bilaterally clear    HEENT: Normocephalic atraumatic eyes are anicteric    Other:    Pertinant exam for procedure: Soft nontender nondistended bowel sounds positive normoactive, protuberant.    Planned proceedures: Colonoscopy and indicated procedures. I have discussed the risks benefits and complications of procedures to include not limited to bleeding, infection, perforation and the risk of sedation and the patient wishes to proceed.    Justin Sails, MD Gastroenterology 08/16/2018  8:44 AM

## 2018-08-16 NOTE — Anesthesia Post-op Follow-up Note (Signed)
Anesthesia QCDR form completed.        

## 2018-08-16 NOTE — Op Note (Signed)
[facility] Gastroenterology Patient Name: [name] Procedure Date: 08/16/2018 8:36 AM MRN: [id number] Account #: [id number] Date of Birth: January 28, 1945 Admit Type: Outpatient Age: 73 Room: [facility] ENDO ROOM 1 Gender: Male Note Status: Finalized Procedure:            Colonoscopy Indications:          Personal history of colonic polyps Providers:            Lollie Sails, MD Referring MD:         Youlanda Roys. Lovie Macadamia, MD (Referring MD) Medicines:            Monitored Anesthesia Care Complications:        No immediate complications. Procedure:            Pre-Anesthesia Assessment:                       - ASA Grade Assessment: III - A patient with severe                        systemic disease.                       After obtaining informed consent, the colonoscope was                        passed under direct vision. Throughout the procedure,                        the patient's blood pressure, pulse, and oxygen                        saturations were monitored continuously. The                        Colonoscope was introduced through the anus and                        advanced to the the cecum, identified by appendiceal                        orifice and ileocecal valve. The colonoscopy was                        unusually difficult due to poor bowel prep. Successful                        completion of the procedure was aided by lavage. Findings:      A 5 mm polyp was found in the mid descending colon. The polyp was       sessile. The polyp was removed with a cold snare. Resection and       retrieval were complete.      A 7 mm polyp was found in the rectum. The polyp was sessile. The polyp       was removed with a cold snare. Resection and retrieval were complete.      A few small-mouthed diverticula were found in the sigmoid colon.      Non-bleeding internal hemorrhoids were found during retroflexion. The       hemorrhoids were Grade I (internal  hemorrhoids that do not prolapse).      No additional  abnormalities were found on retroflexion. Impression:           - One 5 mm polyp in the mid descending colon, removed                        with a cold snare. Resected and retrieved.                       - One 7 mm polyp in the rectum, removed with a cold                        snare. Resected and retrieved.                       - Diverticulosis in the sigmoid colon.                       - Non-bleeding internal hemorrhoids. Recommendation:       - Discharge patient to home. Procedure Code(s):    --- Professional ---                       [phone], Colonoscopy, flexible; with removal of tumor(s),                        polyp(s), or other lesion(s) by snare technique Diagnosis Code(s):    --- Professional ---                       K63.5, Polyp of colon                       K62.1, Rectal polyp                       K64.0, First degree hemorrhoids                       Z86.010, Personal history of colonic polyps                       K57.30, Diverticulosis of large intestine without                        perforation or abscess without bleeding CPT copyright 2019 American Medical Association. All rights reserved. The codes documented in this report are preliminary and upon coder review may  be revised to meet current compliance requirements. Lollie Sails, MD 08/16/2018 9:31:48 AM This report has been signed electronically. Number of Addenda: 0 Note Initiated On: 08/16/2018 8:36 AM Scope Withdrawal Time: 0 hours 12 minutes 6 seconds  Total Procedure Duration: 0 hours 28 minutes 13 seconds       [facility]

## 2018-08-17 ENCOUNTER — Encounter: Payer: Self-pay | Admitting: Gastroenterology

## 2018-08-17 LAB — SURGICAL PATHOLOGY

## 2018-08-17 NOTE — Anesthesia Postprocedure Evaluation (Signed)
Anesthesia Post Note  Patient: [name]  Procedure(s) Performed: COLONOSCOPY WITH PROPOFOL (N/A )  Patient location during evaluation: Endoscopy Anesthesia Type: General Level of consciousness: awake and alert and oriented Pain management: pain level controlled Vital Signs Assessment: post-procedure vital signs reviewed and stable Respiratory status: spontaneous breathing Cardiovascular status: blood pressure returned to baseline Anesthetic complications: no     Last Vitals:  Vitals:   08/16/18 0956 08/16/18 1006  BP: 129/66 (!) 136/59  Pulse: 75   Resp:    Temp:    SpO2: 99% 100%    Last Pain:  Vitals:   08/16/18 1006  TempSrc:   PainSc: 0-No pain                 Paralee Pendergrass

## 2018-12-14 ENCOUNTER — Other Ambulatory Visit: Payer: Self-pay

## 2018-12-14 ENCOUNTER — Other Ambulatory Visit: Payer: Medicare Other

## 2018-12-14 DIAGNOSIS — C61 Malignant neoplasm of prostate: Secondary | ICD-10-CM

## 2018-12-15 LAB — PSA: Prostate Specific Ag, Serum: 1.6 ng/mL (ref 0.0–4.0)

## 2018-12-19 ENCOUNTER — Other Ambulatory Visit: Payer: Self-pay

## 2018-12-19 ENCOUNTER — Encounter: Payer: Self-pay | Admitting: Urology

## 2018-12-19 ENCOUNTER — Ambulatory Visit (INDEPENDENT_AMBULATORY_CARE_PROVIDER_SITE_OTHER): Payer: Medicare Other | Admitting: Urology

## 2018-12-19 VITALS — BP 142/80 | HR 90 | Ht 66.0 in | Wt 240.0 lb

## 2018-12-19 DIAGNOSIS — R972 Elevated prostate specific antigen [PSA]: Secondary | ICD-10-CM | POA: Diagnosis not present

## 2018-12-19 DIAGNOSIS — N5203 Combined arterial insufficiency and corporo-venous occlusive erectile dysfunction: Secondary | ICD-10-CM

## 2018-12-19 DIAGNOSIS — C61 Malignant neoplasm of prostate: Secondary | ICD-10-CM | POA: Diagnosis not present

## 2018-12-19 NOTE — Progress Notes (Signed)
12/19/2018 10:40 AM   [name] 1945-11-14 [id number]  Referring provider: Juluis Pitch, MD [phone] [place],  [place] [address]  Chief Complaint  Patient presents with  . Follow-up    HPI: 73 year old male with personal shave prostate cancer returns today for routine 66-month follow-up.  He was diagnosed with prostate cancer, Gleason 4+3, T2 in 2011.  He was treated with brachytherapy in November 2011.  His nadir appears to be 0.11.  Over the past several years, his PSA has been climbing slowly.  PSA trend as below.  6 months ago, his PSA was 1.4 now 1.6.  He also has a history of microscopic hematuria.  He was seen and evaluated in 2017 by Dr. Bernardo Heater.  Cystoscopy was unremarkable.  Microscopic hematuria felt to be possibly related to voiding through his foreskin. Renal ultrasound showed bilateral renal cysts but was otherwise unremarkable.  Most recent UA on 06/2017 negative for RBC.    He denies any episodes of gross hematuria today.  He also has ED and uses sidliafil as needed.      He denies any significant urinary issues.    Unchanged from previous.  His PSA trend is as below.  PSA: 0.11 04/2014 0.28 03/2015  0.51 05/2016 0.96 07/2017 1.48 01/2018 1.4  06/2018 1.6  12/2018    PMH: Past Medical History:  Diagnosis Date  . Cancer ([place])   . COPD (chronic obstructive pulmonary disease) ([place])   . Diabetes mellitus without complication ([place])   . Hyperlipidemia   . Hypertension   . Left thyroid nodule   . Prostate CA ([place])   . Renal insufficiency     Surgical History: Past Surgical History:  Procedure Laterality Date  . COLONOSCOPY WITH PROPOFOL    . COLONOSCOPY WITH PROPOFOL N/A 08/16/2018   Procedure: COLONOSCOPY WITH PROPOFOL;  Surgeon: Lollie Sails, MD;  Location: [facility] ENDOSCOPY;  Service: Endoscopy;  Laterality: N/A;  . insertion of radiation seeds      Home Medications:  Allergies as of 12/19/2018      Reactions   Penicillins  Swelling   Eggs Or Egg-derived Products Other (See Comments)      Medication List       Accurate as of December 19, 2018 10:40 AM. If you have any questions, ask your nurse or doctor.        Advair Diskus 250-50 MCG/DOSE Aepb Generic drug: Fluticasone-Salmeterol INHALE 1 PUFF INTO THE LUNGS EVERY 12 HOURS   albuterol 108 (90 Base) MCG/ACT inhaler Commonly known as: VENTOLIN HFA Inhale 2 puffs into the lungs every 6 (six) hours as needed for wheezing or shortness of breath.   amLODipine 5 MG tablet Commonly known as: NORVASC Take 5 mg by mouth daily.   aspirin EC 81 MG tablet Take 81 mg by mouth daily.   atorvastatin 40 MG tablet Commonly known as: LIPITOR Take 40 mg by mouth daily.   Cialis 20 MG tablet Generic drug: tadalafil Take 20 mg by mouth as needed.   furosemide 20 MG tablet Commonly known as: LASIX TAKE 1 TABLET(20 MG) BY MOUTH EVERY DAY   losartan 100 MG tablet Commonly known as: COZAAR Take 100 mg by mouth daily.   metFORMIN 850 MG tablet Commonly known as: GLUCOPHAGE Take 850 mg by mouth 2 (two) times daily.   metoprolol tartrate 100 MG tablet Commonly known as: LOPRESSOR Take 50 mg by mouth 2 (two) times daily.   sertraline 50 MG tablet Commonly known as: ZOLOFT  Take 50 mg by mouth daily.   sertraline 100 MG tablet Commonly known as: ZOLOFT Take 100 mg by mouth daily.   sildenafil 20 MG tablet Commonly known as: REVATIO Take 100 mg by mouth as needed.   Symbicort 80-4.5 MCG/ACT inhaler Generic drug: budesonide-formoterol Inhale 2 puffs into the lungs 2 (two) times daily.   traZODone 100 MG tablet Commonly known as: DESYREL Take 100 mg by mouth at bedtime as needed for sleep.       Allergies:  Allergies  Allergen Reactions  . Penicillins Swelling  . Eggs Or Egg-Derived Products Other (See Comments)    Family History: No family history on file.  Social History:  reports that he quit smoking about 19 years ago. He has never  used smokeless tobacco. He reports that he does not drink alcohol or use drugs.  ROS: UROLOGY Frequent Urination?: No Hard to postpone urination?: No Burning/pain with urination?: No Get up at night to urinate?: No Leakage of urine?: No Urine stream starts and stops?: No Trouble starting stream?: No Do you have to strain to urinate?: No Blood in urine?: No Urinary tract infection?: No Sexually transmitted disease?: No Injury to kidneys or bladder?: No Painful intercourse?: No Weak stream?: No Erection problems?: No Penile pain?: No  Gastrointestinal Nausea?: No Vomiting?: No Indigestion/heartburn?: No Diarrhea?: No Constipation?: No  Constitutional Fever: No Night sweats?: No Weight loss?: No Fatigue?: No  Skin Skin rash/lesions?: No Itching?: No  Eyes Blurred vision?: No Double vision?: No  Ears/Nose/Throat Sore throat?: No Sinus problems?: No  Hematologic/Lymphatic Swollen glands?: No Easy bruising?: No  Cardiovascular Leg swelling?: No Chest pain?: No  Respiratory Cough?: No Shortness of breath?: No  Endocrine Excessive thirst?: No  Musculoskeletal Back pain?: No Joint pain?: No  Neurological Headaches?: No Dizziness?: No  Psychologic Depression?: No Anxiety?: No  Physical Exam: BP (!) 142/80   Pulse 90   Ht 5\' 6"  (1.676 m)   Wt 240 lb (108.9 kg)   BMI 38.74 kg/m   Constitutional:  Alert and oriented, No acute distress. HEENT: [place] AT, moist mucus membranes.  Trachea midline, no masses. Cardiovascular: No clubbing, cyanosis, or edema. Respiratory: Normal respiratory effort, no increased work of breathing. GI: Abdomen is soft, nontender, nondistended, no abdominal masses GU: No CVA tenderness Lymph: No cervical or inguinal lymphadenopathy. Skin: No rashes, bruises or suspicious lesions. Neurologic: Grossly intact, no focal deficits, moving all 4 extremities. Psychiatric: Normal mood and affect.  Laboratory Data: Lab Results   Component Value Date   WBC 14.0 (H) 01/31/2018   HGB 12.3 (L) 01/31/2018   HCT 41.1 01/31/2018   MCV 84.4 01/31/2018   PLT 272 01/31/2018    Lab Results  Component Value Date   CREATININE 1.51 (H) 01/31/2018   PSA trend as above  Assessment & Plan:    1. Prostate cancer [facility]) Personal history of prostate cancer  More recently, his PSA has been rising but has not yet reached criteria for biochemical recurrence although suspicious, please see previous notes for details  Given that his PSA is only risen very small amount over the past 12 months, we can continue to follow him on a annual basis with PSA prior  Once his PSA reaches 2.11, plan on repeat imaging to which he is agreeable  2. Rising PSA level As above - PSA; Future  3. Combined arterial insufficiency and corporo-venous occlusive erectile dysfunction Sildenafil as needed  4.  History of microscopic hematuria plan to recheck his urine next  year given his history of microscopic hematuria   Return in about 1 year (around 12/19/2019) for PSA/ DRE, UA.  [name] Espy, MD  [facility] [address] [place], [place] [address] [phone]

## 2019-12-02 ENCOUNTER — Other Ambulatory Visit: Payer: Self-pay | Admitting: Family Medicine

## 2019-12-19 ENCOUNTER — Other Ambulatory Visit: Payer: Self-pay

## 2019-12-19 ENCOUNTER — Other Ambulatory Visit: Payer: Medicare Other

## 2019-12-19 DIAGNOSIS — R972 Elevated prostate specific antigen [PSA]: Secondary | ICD-10-CM

## 2019-12-20 LAB — PSA: Prostate Specific Ag, Serum: 2.6 ng/mL (ref 0.0–4.0)

## 2019-12-25 ENCOUNTER — Other Ambulatory Visit: Payer: Self-pay

## 2019-12-25 ENCOUNTER — Ambulatory Visit (INDEPENDENT_AMBULATORY_CARE_PROVIDER_SITE_OTHER): Payer: Medicare Other | Admitting: Urology

## 2019-12-25 VITALS — BP 130/80 | HR 70 | Ht 66.0 in | Wt 265.0 lb

## 2019-12-25 DIAGNOSIS — N5203 Combined arterial insufficiency and corporo-venous occlusive erectile dysfunction: Secondary | ICD-10-CM | POA: Diagnosis not present

## 2019-12-25 DIAGNOSIS — C61 Malignant neoplasm of prostate: Secondary | ICD-10-CM | POA: Diagnosis not present

## 2019-12-25 LAB — MICROSCOPIC EXAMINATION: Bacteria, UA: NONE SEEN

## 2019-12-25 LAB — URINALYSIS, COMPLETE
Bilirubin, UA: NEGATIVE
Glucose, UA: NEGATIVE
Ketones, UA: NEGATIVE
Leukocytes,UA: NEGATIVE
Nitrite, UA: NEGATIVE
RBC, UA: NEGATIVE
Specific Gravity, UA: 1.02 (ref 1.005–1.030)
Urobilinogen, Ur: 0.2 mg/dL (ref 0.2–1.0)
pH, UA: 7 (ref 5.0–7.5)

## 2019-12-25 NOTE — Progress Notes (Signed)
12/25/2019 12:04 PM   [name] 1945/06/24 [id number]  Referring provider: Juluis Pitch, MD [phone] [place],  [place] [address]  Chief Complaint  Patient presents with  . Prostate Cancer    HPI: 74 year old male with personal history of prostate cancer who returns today for routine annual follow-up.  He was diagnosed with prostate cancer,Gleason 4+3,T2 in 2011. He was treated with brachytherapy in November 2011.His nadir appears to be 0.11.  Over the past several years, his PSA has been climbing slowly.  PSA trend as below.    He also hasa history of microscopic hematuria. He was seen and evaluated in 2017 by Dr. Bernardo Oneal.  Cystoscopy was unremarkable.  Microscopic hematuria felt to be possibly related to voiding through his foreskin.Renal ultrasound showed bilateral renal cysts but was otherwise unremarkable. Most recent UA on 06/2017 negative for RBC.   He denies any episodes of gross hematuria today.  He also has ED and uses sidliafil as needed.    He denies any significant urinary issues.  Unchanged from previous.  His PSA trend is as below.  PSA: 0.11 04/2014 0.28 03/2015  0.51 05/2016 0.96 07/2017 1.48 01/2018 1.4  06/2018 1.6  12/2018 2.6  12/2019  PMH: Past Medical History:  Diagnosis Date  . Cancer ([place])   . COPD (chronic obstructive pulmonary disease) ([place])   . Diabetes mellitus without complication ([place])   . Hyperlipidemia   . Hypertension   . Left thyroid nodule   . Prostate CA ([place])   . Renal insufficiency     Surgical History: Past Surgical History:  Procedure Laterality Date  . COLONOSCOPY WITH PROPOFOL    . COLONOSCOPY WITH PROPOFOL N/A 08/16/2018   Procedure: COLONOSCOPY WITH PROPOFOL;  Surgeon: Lollie Sails, MD;  Location[facility] ENDOSCOPY;  Service: Endoscopy;  Laterality: N/A;  . insertion of radiation seeds      Home Medications:  Allergies as of 12/25/2019      Reactions   Penicillins Swelling    Eggs Or Egg-derived Products Other (See Comments)      Medication List       Accurate as of December 25, 2019 11:59 PM. If you have any questions, ask your nurse or doctor.        albuterol 108 (90 Base) MCG/ACT inhaler Commonly known as: VENTOLIN HFA Inhale 2 puffs into the lungs every 6 (six) hours as needed for wheezing or shortness of breath.   amLODipine 5 MG tablet Commonly known as: NORVASC Take 5 mg by mouth daily.   aspirin EC 81 MG tablet Take 81 mg by mouth daily.   atorvastatin 40 MG tablet Commonly known as: LIPITOR Take 40 mg by mouth daily.   Fluticasone-Salmeterol 250-50 MCG/DOSE Aepb Commonly known as: ADVAIR INHALE 1 PUFF INTO THE LUNGS EVERY 12 HOURS   furosemide 20 MG tablet Commonly known as: LASIX TAKE 1 TABLET(20 MG) BY MOUTH EVERY DAY   hydrALAZINE 50 MG tablet Commonly known as: APRESOLINE Take 50 mg by mouth 3 (three) times daily.   losartan 100 MG tablet Commonly known as: COZAAR Take 100 mg by mouth daily.   metFORMIN 850 MG tablet Commonly known as: GLUCOPHAGE Take 850 mg by mouth 2 (two) times daily.   metoprolol tartrate 100 MG tablet Commonly known as: LOPRESSOR Take 50 mg by mouth 2 (two) times daily.   sertraline 50 MG tablet Commonly known as: ZOLOFT Take 50 mg by mouth daily.   sertraline 100 MG tablet Commonly known as:  ZOLOFT Take 100 mg by mouth daily.   sildenafil 20 MG tablet Commonly known as: REVATIO Take 100 mg by mouth as needed.   Symbicort 80-4.5 MCG/ACT inhaler Generic drug: budesonide-formoterol Inhale 2 puffs into the lungs 2 (two) times daily.   tadalafil 20 MG tablet Commonly known as: CIALIS Take 20 mg by mouth as needed.   traZODone 100 MG tablet Commonly known as: DESYREL Take 100 mg by mouth at bedtime as needed for sleep.       Allergies:  Allergies  Allergen Reactions  . Penicillins Swelling  . Eggs Or Egg-Derived Products Other (See Comments)    Family History: No family  history on file.  Social History:  reports that he quit smoking about 20 years ago. He has never used smokeless tobacco. He reports that he does not drink alcohol and does not use drugs.   Physical Exam: BP 130/80   Pulse 70   Ht 5\' 6"  (1.676 m)   Wt 265 lb (120.2 kg)   BMI 42.77 kg/m   Constitutional:  Alert and oriented, No acute distress. HEENT: [place] AT, moist mucus membranes.  Trachea midline, no masses. Cardiovascular: No clubbing, cyanosis, or edema. Respiratory: Normal respiratory effort, no increased work of breathing. Skin: No rashes, bruises or suspicious lesions. Neurologic: Grossly intact, no focal deficits, moving all 4 extremities. Psychiatric: Normal mood and affect.  Results for orders placed or performed in visit on 12/25/19  Microscopic Examination   Urine  Result Value Ref Range   WBC, UA 0-5 0 - 5 /hpf   RBC 0-2 0 - 2 /hpf   Epithelial Cells (non renal) 0-10 0 - 10 /hpf   Bacteria, UA None seen None seen/Few  Urinalysis, Complete  Result Value Ref Range   Specific Gravity, UA 1.020 1.005 - 1.030   pH, UA 7.0 5.0 - 7.5   Color, UA Yellow Yellow   Appearance Ur Clear Clear   Leukocytes,UA Negative Negative   Protein,UA 1+ (A) Negative/Trace   Glucose, UA Negative Negative   Ketones, UA Negative Negative   RBC, UA Negative Negative   Bilirubin, UA Negative Negative   Urobilinogen, Ur 0.2 0.2 - 1.0 mg/dL   Nitrite, UA Negative Negative   Microscopic Examination See below:      Assessment & Plan:    1. Prostate cancer [name]) with biochemical recurrence Status post brachy seed implantation 10 years ago  PSA has been slowly trending upwards and he now meets the definition for biochemical recurrence, 2 points above his nadir with a doubling time of greater than a year  Remains asymptomatic   As per our previous conversation, plan for staging imaging, will order F-18 PMSA pet scan to rule out metastatic disease as well as assess for local recurrence in  the prostate bed  Return for results/ discussion of treatment options  - Urinalysis, Complete - NM PET (F18-[id number]) SKULL TO MID THIGH; Future  2. Combined arterial insufficiency and corporo-venous occlusive erectile dysfunction Continue sildenafil as needed   [name] Espy, MD  [facility] [address] [place], [place] [address] [phone]

## 2020-02-06 ENCOUNTER — Ambulatory Visit: Payer: Self-pay | Admitting: Urology

## 2020-02-20 ENCOUNTER — Ambulatory Visit: Payer: Self-pay | Admitting: Urology

## 2020-02-27 ENCOUNTER — Ambulatory Visit
Admission: RE | Admit: 2020-02-27 | Discharge: 2020-02-27 | Disposition: A | Discharge status: Home / Self Care | Payer: Medicare Other | Source: Ambulatory Visit | Attending: Urology | Admitting: Urology

## 2020-02-27 DIAGNOSIS — C61 Malignant neoplasm of prostate: Secondary | ICD-10-CM | POA: Insufficient documentation

## 2020-02-27 DIAGNOSIS — I700 Atherosclerosis of aorta: Secondary | ICD-10-CM | POA: Insufficient documentation

## 2020-02-27 MED ORDER — PIFLIFOLASTAT F 18 (PYLARIFY) INJECTION
9.0000 | Freq: Once | INTRAVENOUS | Status: AC
  Administered 2020-02-27: 9.566 via INTRAVENOUS

## 2020-03-03 ENCOUNTER — Ambulatory Visit: Payer: Medicare Other | Admitting: Urology

## 2020-03-04 ENCOUNTER — Encounter: Payer: Self-pay | Admitting: Urology

## 2020-03-13 ENCOUNTER — Telehealth: Payer: Self-pay | Admitting: *Deleted

## 2020-03-13 NOTE — Telephone Encounter (Addendum)
Unable to leave message on cell #, left VM on home phone #-asked to return call to scheduled follow up  ----- Message from [name] Espy, MD sent at 03/13/2020  8:54 AM EST ----- This gentleman missed his follow-up with me and has not rescheduled. Can you please reach out to him and encourage him to reschedule to discuss options?  [name] Espy, MD

## 2020-03-16 NOTE — Telephone Encounter (Signed)
Reached patient to schedule follow up-patient scheduled-voiced understanding.

## 2020-03-25 ENCOUNTER — Encounter: Payer: Self-pay | Admitting: Urology

## 2020-03-25 ENCOUNTER — Ambulatory Visit (INDEPENDENT_AMBULATORY_CARE_PROVIDER_SITE_OTHER): Payer: Medicare Other | Admitting: Urology

## 2020-03-25 ENCOUNTER — Other Ambulatory Visit: Payer: Self-pay

## 2020-03-25 VITALS — BP 123/61 | HR 80

## 2020-03-25 DIAGNOSIS — R972 Elevated prostate specific antigen [PSA]: Secondary | ICD-10-CM | POA: Diagnosis not present

## 2020-03-25 DIAGNOSIS — C61 Malignant neoplasm of prostate: Secondary | ICD-10-CM

## 2020-03-25 NOTE — Progress Notes (Signed)
03/25/2020 9:16 AM   [name] July 23, 1945 [id number]  Referring provider: Juluis Pitch, MD [phone] [place],  [place] [address]  Chief Complaint  Patient presents with  . Results    HPI: 76 year old male with personal history of prostate cancer  S/p brachytherapy with rising PSA.  He returns today after PSMA PET scan.  This shows possible focal recurrence at the left base but no other evidence of metastatic disease.  PSA trend as below.  He denies any urinary symptoms.  He wasdiagnosed with prostate cancer,Gleason 4+3,T2 in 2011. He was treated with brachytherapy in November 2011.His nadir appears to be 0.11.  PSA: 0.11 04/2014 0.28 03/2015  0.51 05/2016 0.96 07/2017 1.48 01/2018 1.4 06/2018 1.6 12/2018 2.6  12/2019   PMH: Past Medical History:  Diagnosis Date  . Cancer ([place])   . COPD (chronic obstructive pulmonary disease) ([place])   . Diabetes mellitus without complication ([place])   . Hyperlipidemia   . Hypertension   . Left thyroid nodule   . Prostate CA ([place])   . Renal insufficiency     Surgical History: Past Surgical History:  Procedure Laterality Date  . COLONOSCOPY WITH PROPOFOL    . COLONOSCOPY WITH PROPOFOL N/A 08/16/2018   Procedure: COLONOSCOPY WITH PROPOFOL;  Surgeon: Lollie Sails, MD;  Location: [facility] ENDOSCOPY;  Service: Endoscopy;  Laterality: N/A;  . insertion of radiation seeds      Home Medications:  Allergies as of 03/25/2020      Reactions   Penicillins Swelling   Eggs Or Egg-derived Products Other (See Comments)      Medication List       Accurate as of March 25, 2020  9:16 AM. If you have any questions, ask your nurse or doctor.        albuterol 108 (90 Base) MCG/ACT inhaler Commonly known as: VENTOLIN HFA Inhale 2 puffs into the lungs every 6 (six) hours as needed for wheezing or shortness of breath.   amLODipine 5 MG tablet Commonly known as: NORVASC Take 5 mg by mouth daily.   aspirin EC 81 MG  tablet Take 81 mg by mouth daily.   atorvastatin 40 MG tablet Commonly known as: LIPITOR Take 40 mg by mouth daily.   Fluticasone-Salmeterol 250-50 MCG/DOSE Aepb Commonly known as: ADVAIR INHALE 1 PUFF INTO THE LUNGS EVERY 12 HOURS   furosemide 20 MG tablet Commonly known as: LASIX TAKE 1 TABLET(20 MG) BY MOUTH EVERY DAY   hydrALAZINE 50 MG tablet Commonly known as: APRESOLINE Take 50 mg by mouth 3 (three) times daily.   losartan 100 MG tablet Commonly known as: COZAAR Take 100 mg by mouth daily.   metFORMIN 850 MG tablet Commonly known as: GLUCOPHAGE Take 850 mg by mouth 2 (two) times daily.   metoprolol tartrate 100 MG tablet Commonly known as: LOPRESSOR Take 50 mg by mouth 2 (two) times daily.   sertraline 50 MG tablet Commonly known as: ZOLOFT Take 50 mg by mouth daily.   sertraline 100 MG tablet Commonly known as: ZOLOFT Take 100 mg by mouth daily.   sildenafil 20 MG tablet Commonly known as: REVATIO Take 100 mg by mouth as needed.   Symbicort 80-4.5 MCG/ACT inhaler Generic drug: budesonide-formoterol Inhale 2 puffs into the lungs 2 (two) times daily.   tadalafil 20 MG tablet Commonly known as: CIALIS Take 20 mg by mouth as needed.   traZODone 100 MG tablet Commonly known as: DESYREL Take 100 mg by mouth at bedtime as  needed for sleep.       Allergies:  Allergies  Allergen Reactions  . Penicillins Swelling  . Eggs Or Egg-Derived Products Other (See Comments)    Family History: No family history on file.  Social History:  reports that he quit smoking about 21 years ago. He has never used smokeless tobacco. He reports that he does not drink alcohol and does not use drugs.   Physical Exam: BP 123/61   Pulse 80   Constitutional:  Alert and oriented, No acute distress. HEENT: [place] AT, moist mucus membranes.  Trachea midline, no masses. Cardiovascular: No clubbing, cyanosis, or edema. Respiratory: Normal respiratory effort, no increased work  of breathing. Skin: No rashes, bruises or suspicious lesions. Neurologic: Grossly intact, no focal deficits, moving all 4 extremities. Psychiatric: Normal mood and affect.  Laboratory Data: Lab Results  Component Value Date   WBC 14.0 (H) 01/31/2018   HGB 12.3 (L) 01/31/2018   HCT 41.1 01/31/2018   MCV 84.4 01/31/2018   PLT 272 01/31/2018    Urinalysis    Component Value Date/Time   APPEARANCEUR Clear 12/25/2019 0944   GLUCOSEU Negative 12/25/2019 0944   BILIRUBINUR Negative 12/25/2019 0944   PROTEINUR 1+ (A) 12/25/2019 0944   NITRITE Negative 12/25/2019 0944   LEUKOCYTESUR Negative 12/25/2019 0944    Lab Results  Component Value Date   LABMICR See below: 12/25/2019   WBCUA 0-5 12/25/2019   LABEPIT 0-10 12/25/2019   BACTERIA None seen 12/25/2019    Pertinent Imaging: Narrative & Impression  CLINICAL DATA:  Prostate carcinoma with biochemical recurrence. 75 year old male. Prostate cancer surveillance. History of brachytherapy and chemotherapy  EXAM: NUCLEAR MEDICINE PET SKULL BASE TO THIGH  TECHNIQUE: 9.6 mCi F18 Piflufolastat (Pylarify) was injected intravenously. Full-ring PET imaging was performed from the skull base to thigh after the radiotracer. CT data was obtained and used for attenuation correction and anatomic localization.  COMPARISON:  CT 09/30/2009  FINDINGS: NECK  No radiotracer activity in neck lymph nodes.  Incidental CT finding: None  CHEST  No radiotracer accumulation within mediastinal or hilar lymph nodes. No suspicious pulmonary nodules on the CT scan.  Incidental CT finding: None  ABDOMEN/PELVIS  Prostate: Focus of radiotracer activity in the prostate gland greater than background. This lesion is at the LEFT base positioned at the junction of the LEFT base and the LEFT seminal vesicle with SUV max equal 5.6 on image 60.  No additional focal activity within the gland. Multiple brachytherapy seeds noted.  Lymph  nodes: No abnormal radiotracer accumulation within pelvic or abdominal nodes.  Liver: No evidence of liver metastasis  Incidental CT finding: Benign cysts of the kidneys. Atherosclerotic calcification of the aorta.  SKELETON  No focal activity to suggest skeletal metastasis. No sclerotic lesions present  IMPRESSION: 1. Focal activity at the LEFT base of the prostate gland could represent prostate cancer recurrence. 2. No evidence of metastatic adenopathy in the pelvis or periaortic retroperitoneum. 3. No evidence of visceral metastasis or skeletal metastasis.   Electronically Signed   By: Suzy Bouchard M.D.   On: 02/27/2020 17:15   PET scan was personally reviewed, agree with radiologic interpretation.   Assessment & Plan:    1. Prostate cancer [facility]) PET scan reviewed, possible local focal recurrence within the left base of the prostate although this also could be false positive  Repeat PSA today  At this point, I recommended referral to [facility] urology for consultation with Delorise Royals.  Discussed with patient that he may need  a prostate biopsy to prove that this is in fact a focal recurrence and may be candidate for salvage such as cryo versus HIFU depending on findings.  I will defer further management to Dr. Alford Highland.  If he does not have follow-up in the next couple of months I do, he was advised to let us know so we can help facilitate the referral beyond placing an order.  Additionally, if he does elect surveillance, him happy to continue to follow him here. - PSA; Future - Ambulatory referral to Urology  2. Rising PSA level As above   [name] Espy, MD  [facility] [address] [place], [place] [address] [phone]  I spent 30 total minutes on the day of the encounter including pre-visit review of the medical record, face-to-face time with the patient, and post visit ordering of  labs/imaging/tests.

## 2020-03-26 LAB — PSA: Prostate Specific Ag, Serum: 3.3 ng/mL (ref 0.0–4.0)

## 2020-03-27 ENCOUNTER — Telehealth: Payer: Self-pay

## 2020-03-27 NOTE — Telephone Encounter (Signed)
-----   Message from [name] Espy, MD sent at 03/26/2020  8:18 AM EDT ----- PSA continues to rise.  Please let us know if you don't hear from [facility] urology in the next few weeks.    [name] Espy, MD

## 2020-03-27 NOTE — Telephone Encounter (Signed)
Pt aware and verbalized understanding.  

## 2020-11-26 ENCOUNTER — Ambulatory Visit: Payer: Medicare Other | Admitting: Anesthesiology

## 2020-11-26 ENCOUNTER — Encounter: Payer: Self-pay | Admitting: Internal Medicine

## 2020-11-26 ENCOUNTER — Ambulatory Visit
Admission: RE | Admit: 2020-11-26 | Discharge: 2020-11-26 | Disposition: A | Discharge status: Home / Self Care | Payer: Medicare Other | Attending: Internal Medicine | Admitting: Internal Medicine

## 2020-11-26 ENCOUNTER — Encounter
Admission: RE | Disposition: A | Discharge status: Home / Self Care | Payer: Self-pay | Source: Home / Self Care | Attending: Internal Medicine

## 2020-11-26 ENCOUNTER — Ambulatory Visit
Admission: RE | Admit: 2020-11-26 | Discharge: 2020-11-26 | Disposition: A | Discharge status: Home / Self Care | Payer: Medicare Other | Source: Home / Self Care | Attending: Internal Medicine | Admitting: Internal Medicine

## 2020-11-26 DIAGNOSIS — I4811 Longstanding persistent atrial fibrillation: Secondary | ICD-10-CM

## 2020-11-26 DIAGNOSIS — I480 Paroxysmal atrial fibrillation: Secondary | ICD-10-CM | POA: Diagnosis present

## 2020-11-26 DIAGNOSIS — I351 Nonrheumatic aortic (valve) insufficiency: Secondary | ICD-10-CM | POA: Diagnosis not present

## 2020-11-26 DIAGNOSIS — E119 Type 2 diabetes mellitus without complications: Secondary | ICD-10-CM | POA: Insufficient documentation

## 2020-11-26 DIAGNOSIS — E222 Syndrome of inappropriate secretion of antidiuretic hormone: Secondary | ICD-10-CM

## 2020-11-26 HISTORY — PX: CARDIOVERSION: SHX1299

## 2020-11-26 HISTORY — PX: TEE WITHOUT CARDIOVERSION: SHX5443

## 2020-11-26 LAB — PROTIME-INR
INR: 1.2 (ref 0.8–1.2)
Prothrombin Time: 14.8 seconds (ref 11.4–15.2)

## 2020-11-26 LAB — GLUCOSE, CAPILLARY: Glucose-Capillary: 152 mg/dL — ABNORMAL HIGH (ref 70–99)

## 2020-11-26 SURGERY — CARDIOVERSION
Anesthesia: General

## 2020-11-26 MED ORDER — PROPOFOL 10 MG/ML IV BOLUS
INTRAVENOUS | Status: DC | PRN
  Administered 2020-11-26 (×2): 50 mg via INTRAVENOUS
  Administered 2020-11-26 (×2): 40 mg via INTRAVENOUS
  Administered 2020-11-26 (×2): 20 mg via INTRAVENOUS

## 2020-11-26 MED ORDER — APIXABAN 5 MG PO TABS: 5.0000 mg | ORAL_TABLET | Freq: Two times a day (BID) | ORAL | Status: DC

## 2020-11-26 MED ORDER — SODIUM CHLORIDE 0.9 % IV SOLN: INTRAVENOUS | Status: DC

## 2020-11-26 MED ORDER — HEPARIN SODIUM (PORCINE) 1000 UNIT/ML IJ SOLN
INTRAMUSCULAR | Status: AC
  Filled 2020-11-26: qty 1

## 2020-11-26 MED ORDER — SODIUM CHLORIDE 0.9 % IV SOLN
INTRAVENOUS | Status: DC
  Administered 2020-11-26: 08:00:00 1000 mL via INTRAVENOUS

## 2020-11-26 MED ORDER — APIXABAN 5 MG PO TABS: 5.0000 mg | ORAL_TABLET | Freq: Two times a day (BID) | ORAL | 6 refills | Status: AC

## 2020-11-26 MED ORDER — HEPARIN SODIUM (PORCINE) 5000 UNIT/ML IJ SOLN
5000.0000 [IU] | Freq: Once | INTRAMUSCULAR | Status: AC
  Administered 2020-11-26: 08:00:00 5000 [IU] via INTRAVENOUS

## 2020-11-26 NOTE — Progress Notes (Signed)
*  PRELIMINARY RESULTS* Echocardiogram Echocardiogram Transesophageal has been performed.  Sherrie Sport 11/26/2020, 8:53 AM

## 2020-11-26 NOTE — Transfer of Care (Signed)
Immediate Anesthesia Transfer of Care Note  Patient: [name]  Procedure(s) Performed: CARDIOVERSION TRANSESOPHAGEAL ECHOCARDIOGRAM (TEE)  Patient Location: spu  Anesthesia Type:General  Level of Consciousness: awake, alert  and oriented  Airway & Oxygen Therapy: Patient Spontanous Breathing and Patient connected to nasal cannula oxygen  Post-op Assessment: Report given to RN and Post -op Vital signs reviewed and stable  Post vital signs: Reviewed  Last Vitals:  Vitals Value Taken Time  BP 118/61 11/26/20 0836  Temp    Pulse 66 11/26/20 0836  Resp 23 11/26/20 0836  SpO2 99 % 11/26/20 0836  Vitals shown include unvalidated device data.  Last Pain:  Vitals:   11/26/20 0721  TempSrc: Oral  PainSc: 0-No pain         Complications: No notable events documented.

## 2020-11-26 NOTE — Anesthesia Preprocedure Evaluation (Signed)
Anesthesia Evaluation  Patient identified by MRN, date of birth, ID band Patient awake    Reviewed: Allergy & Precautions, NPO status , Patient's Chart, lab work & pertinent test results  History of Anesthesia Complications Negative for: history of anesthetic complications  Airway Mallampati: III  TM Distance: >3 FB Neck ROM: Full    Dental  (+) Edentulous Upper, Edentulous Lower   Pulmonary shortness of breath and with exertion, neg sleep apnea, COPD,  COPD inhaler, Patient abstained from smoking.Not current smoker, former smoker,  Likely undiagnosed sleep apnea based on signs and symptom s   Pulmonary exam normal breath sounds clear to auscultation       Cardiovascular Exercise Tolerance: Good METShypertension, Pt. on medications (-) CAD and (-) Past MI + dysrhythmias Atrial Fibrillation + Valvular Problems/Murmurs MR  Rhythm:Irregular Rate:Normal  Unremarkable stress echo 2022  TTE 2022: NORMAL LEFT VENTRICULAR SYSTOLIC FUNCTION WITH AN ESTIMATED EF = >55 %  NORMAL RIGHT VENTRICULAR SYSTOLIC FUNCTION  MODERATE TRICUSPID AND MITRAL VALVE INSUFFICIENCY  TRACE AORTIC VALVE INSUFFICIENCY  NO VALVULAR STENOSIS  MILD RV ENLARGEMENT  MILD BIATRIAL ENLARGEMENT  MILD LVH  MILDLY DILATED AORTIC ROOT MEASURING UP TO 4.0 CM     Neuro/Psych negative neurological ROS  negative psych ROS   GI/Hepatic neg GERD  ,(+)     (-) substance abuse  ,   Endo/Other  diabetes, Oral Hypoglycemic AgentsMorbid obesity  Renal/GU Renal InsufficiencyRenal disease  negative genitourinary   Musculoskeletal negative musculoskeletal ROS (+)   Abdominal (+) + obese,   Peds negative pediatric ROS (+)  Hematology negative hematology ROS (+)   Anesthesia Other Findings Past Medical History: No date: Cancer ([place]) No date: COPD (chronic obstructive pulmonary disease) ([facility]) No date: Diabetes mellitus without complication ([facility]) No date:  Hyperlipidemia No date: Hypertension No date: Left thyroid nodule No date: Prostate CA ([facility]) No date: Renal insufficiency  Reproductive/Obstetrics                             Anesthesia Physical  Anesthesia Plan  ASA: 3  Anesthesia Plan: General   Post-op Pain Management:    Induction: Intravenous  PONV Risk Score and Plan: 2 and Ondansetron, Propofol infusion and TIVA  Airway Management Planned: Nasal Cannula  Additional Equipment: None  Intra-op Plan:   Post-operative Plan:   Informed Consent: I have reviewed the patients History and Physical, chart, labs and discussed the procedure including the risks, benefits and alternatives for the proposed anesthesia with the patient or authorized representative who has indicated his/her understanding and acceptance.     Dental advisory given  Plan Discussed with: CRNA and Surgeon  Anesthesia Plan Comments: (Discussed risks of anesthesia with patient, including possibility of difficulty with spontaneous ventilation under anesthesia necessitating airway intervention, PONV, and rare risks such as cardiac or respiratory or neurological events, and allergic reactions. Discussed the role of CRNA in patient's perioperative care. Patient understands.)        Anesthesia Quick Evaluation

## 2020-11-26 NOTE — CV Procedure (Signed)
Electrical Cardioversion Procedure Note SEON GAERTNER [id number] May 10, 1945  Procedure: Electrical Cardioversion Indications:  Paroxysmal non valvular atrial fibrillation  Procedure Details Consent: Risks of procedure as well as the alternatives and risks of each were explained to the (patient/caregiver).  Consent for procedure obtained. Time Out: Verified patient identification, verified procedure, site/side was marked, verified correct patient position, special equipment/implants available, medications/allergies/relevent history reviewed, required imaging and test results available.  Performed The patient was given 5000units heparin 2min before TEE and a totoal of 22min before cardioversion of which no evidence of apical thrombus seen  Patient placed on cardiac monitor, pulse oximetry, supplemental oxygen as necessary.  Sedation given: Propofol and versed as per anesthesia  Pacer pads placed anterior and posterior chest.  Cardioverted 1 time(s).  Cardioverted at 120J.  Evaluation Findings: Post procedure EKG shows: NSR Complications: None Patient did tolerate procedure well.   Serafina Royals M.D. [facility] 11/26/2020, 8:40 AM

## 2020-11-26 NOTE — CV Procedure (Signed)
Transesophageal echocardiogram preliminary report  RAYNER ERMAN [id number] 1945/10/28  Preliminary diagnosis  Atrial fibrillation with or without cardioversion  Postprocedural diagnosis  Atrial fibrillation without left atrial appendage thrombus  Time out A timeout was performed by the nursing staff and physicians specifically identifying the procedure performed, identification of the patient, the type of sedation, all allergies and medications, all pertinent medical history, and presedation assessment of nasopharynx. The patient and or family understand the risks of the procedure including the rare risks of death, stroke, heart attack, esophogeal perforation, sore throat, and reaction to medications given.  Moderate sedation During this procedure the patient has received propofol per anesthesia to achieve appropriate moderate sedation.  The patient had continued monitoring of heart rate, oxygenation, blood pressure, respiratory rate, and extent of signs of sedation throughout the entire procedure.  The patient received this  propofol sedation over a period of 26 minutes.  Both the nursing staff and I were present during the procedure when the patient had moderate sedation for 100% of the time.  Treatment considerations  Electrical cardioversion of atrial fibrillation due to no evidence of atrial appendage thrombus Patient was given heparin 5000units before TEE and cardioversion due to lack of appropriate use of anticoagulation prior to procedure and was given eliquis 5mg  bid after  For further details of transesophageal echocardiogram please refer to final report.  Signed,  Corey Skains M.D. [facility] 11/26/2020 8:42 AM

## 2020-11-28 NOTE — Anesthesia Postprocedure Evaluation (Signed)
Anesthesia Post Note  Patient: HAZEN BRUMETT  Procedure(s) Performed: CARDIOVERSION TRANSESOPHAGEAL ECHOCARDIOGRAM (TEE)  Patient location during evaluation: Specials Recovery Anesthesia Type: General Level of consciousness: awake and alert Pain management: pain level controlled Vital Signs Assessment: post-procedure vital signs reviewed and stable Respiratory status: spontaneous breathing, nonlabored ventilation, respiratory function stable and patient connected to nasal cannula oxygen Cardiovascular status: blood pressure returned to baseline and stable Postop Assessment: no apparent nausea or vomiting Anesthetic complications: no   No notable events documented.   Last Vitals:  Vitals:   11/26/20 0845 11/26/20 0900  BP: 117/63 114/66  Pulse: 67 65  Resp: 18 19  Temp:    SpO2: 94% 100%    Last Pain:  Vitals:   11/26/20 0915  TempSrc:   PainSc: 0-No pain                 Martha Clan

## 2021-11-29 ENCOUNTER — Telehealth: Payer: Self-pay

## 2021-11-29 NOTE — Telephone Encounter (Signed)
-----   Message from Wallene Dales sent at 11/29/2021  2:51 PM EST ----- Regarding: RE: Pt referral to hematology I have called the patient two times myself with no answer.  Brooke  ----- Message ----- From: Stephens November Sent: 11/29/2021   2:06 PM EST To: Evelina Dun, RN; Wallene Dales Subject: RE: Pt referral to hematology                  LVM for pt to give [place] a call back to scheduled appt  ----- Message ----- From: Evelina Dun, RN Sent: 11/26/2021   1:38 PM EST To: Evelina Dun, RN; Wallene Dales; # Subject: FW: Pt referral to hematology                  Hi ladies, can one of you contact pt to set up NEW [facility] appt.   Thanks.  ----- Message ----- From: Earlie Server, MD Sent: 11/26/2021  12:45 PM EST To: Ezzard Standing, PA-C; Evelina Dun, RN; # Subject: RE: Pt referral to hematology                  Hi Thayer Headings, thanks for the headsup. He has not been scheduled yet. Will forward to my team to keep an eye on it.  Would you please add official peripheral smear to your CBC? Unless you already have one in and I am not able to see.    ----- Message ----- From: Adron Bene Sent: 11/25/2021   2:29 PM EST To: Earlie Server, MD Subject: Pt referral to hematology                      Good afternoon Dr. Tasia Catchings,   I wanted to let you know about a patient I am placing a hematology referral on.  I saw this gentleman this week for anemia that began in August.  He has no signs of GI bleeding, does have iron deficiency and we have an endoscopy colonoscopy arranged.  When I repeated his CBC for comparison they noted polychromasia, hypochromia, burr cells, teardrop cells, schistocytes, elliptocytes, and schistocytosis.  Please let me know if there are any labs she would like me to get prior to his appointment with you. Thank you for your help!  [name] - Nov 11, 1945

## 2021-12-06 ENCOUNTER — Encounter: Payer: Self-pay | Admitting: *Deleted

## 2021-12-07 ENCOUNTER — Encounter
Admission: RE | Disposition: A | Discharge status: Home / Self Care | Payer: Self-pay | Source: Home / Self Care | Attending: Gastroenterology

## 2021-12-07 ENCOUNTER — Encounter: Payer: Self-pay | Admitting: *Deleted

## 2021-12-07 ENCOUNTER — Ambulatory Visit
Admission: RE | Admit: 2021-12-07 | Discharge: 2021-12-07 | Disposition: A | Discharge status: Home / Self Care | Payer: Medicare Other | Attending: Gastroenterology | Admitting: Gastroenterology

## 2021-12-07 ENCOUNTER — Ambulatory Visit: Payer: Medicare Other | Admitting: Anesthesiology

## 2021-12-07 DIAGNOSIS — J449 Chronic obstructive pulmonary disease, unspecified: Secondary | ICD-10-CM | POA: Diagnosis not present

## 2021-12-07 DIAGNOSIS — D509 Iron deficiency anemia, unspecified: Secondary | ICD-10-CM | POA: Diagnosis present

## 2021-12-07 DIAGNOSIS — K3189 Other diseases of stomach and duodenum: Secondary | ICD-10-CM | POA: Diagnosis not present

## 2021-12-07 DIAGNOSIS — K2950 Unspecified chronic gastritis without bleeding: Secondary | ICD-10-CM | POA: Insufficient documentation

## 2021-12-07 DIAGNOSIS — Z6841 Body Mass Index (BMI) 40.0 and over, adult: Secondary | ICD-10-CM | POA: Diagnosis not present

## 2021-12-07 DIAGNOSIS — Q438 Other specified congenital malformations of intestine: Secondary | ICD-10-CM | POA: Diagnosis not present

## 2021-12-07 DIAGNOSIS — E119 Type 2 diabetes mellitus without complications: Secondary | ICD-10-CM | POA: Diagnosis not present

## 2021-12-07 DIAGNOSIS — N289 Disorder of kidney and ureter, unspecified: Secondary | ICD-10-CM | POA: Diagnosis not present

## 2021-12-07 DIAGNOSIS — I340 Nonrheumatic mitral (valve) insufficiency: Secondary | ICD-10-CM | POA: Diagnosis not present

## 2021-12-07 DIAGNOSIS — I4891 Unspecified atrial fibrillation: Secondary | ICD-10-CM | POA: Diagnosis not present

## 2021-12-07 DIAGNOSIS — I10 Essential (primary) hypertension: Secondary | ICD-10-CM | POA: Insufficient documentation

## 2021-12-07 DIAGNOSIS — Z7984 Long term (current) use of oral hypoglycemic drugs: Secondary | ICD-10-CM | POA: Diagnosis not present

## 2021-12-07 DIAGNOSIS — K640 First degree hemorrhoids: Secondary | ICD-10-CM | POA: Insufficient documentation

## 2021-12-07 HISTORY — PX: ESOPHAGOGASTRODUODENOSCOPY (EGD) WITH PROPOFOL: SHX5813

## 2021-12-07 HISTORY — PX: COLONOSCOPY WITH PROPOFOL: SHX5780

## 2021-12-07 LAB — GLUCOSE, CAPILLARY: Glucose-Capillary: 146 mg/dL — ABNORMAL HIGH (ref 70–99)

## 2021-12-07 SURGERY — COLONOSCOPY WITH PROPOFOL
Anesthesia: General

## 2021-12-07 MED ORDER — SODIUM CHLORIDE 0.9 % IV SOLN: INTRAVENOUS | Status: DC

## 2021-12-07 MED ORDER — SODIUM CHLORIDE 0.9 % IV SOLN: INTRAVENOUS | Status: DC | PRN

## 2021-12-07 MED ORDER — KETAMINE HCL 10 MG/ML IJ SOLN
INTRAMUSCULAR | Status: DC | PRN
  Administered 2021-12-07: 30 mg via INTRAVENOUS

## 2021-12-07 MED ORDER — LIDOCAINE HCL (CARDIAC) PF 100 MG/5ML IV SOSY
PREFILLED_SYRINGE | INTRAVENOUS | Status: DC | PRN
  Administered 2021-12-07: 50 mg via INTRAVENOUS

## 2021-12-07 MED ORDER — KETAMINE HCL 50 MG/5ML IJ SOSY
PREFILLED_SYRINGE | INTRAMUSCULAR | Status: AC
  Filled 2021-12-07: qty 5

## 2021-12-07 MED ORDER — PROPOFOL 10 MG/ML IV BOLUS
INTRAVENOUS | Status: DC | PRN
  Administered 2021-12-07: 50 mg via INTRAVENOUS

## 2021-12-07 MED ORDER — MIDAZOLAM HCL 2 MG/2ML IJ SOLN
INTRAMUSCULAR | Status: AC
  Filled 2021-12-07: qty 2

## 2021-12-07 MED ORDER — MIDAZOLAM HCL 2 MG/2ML IJ SOLN
INTRAMUSCULAR | Status: DC | PRN
  Administered 2021-12-07: 2 mg via INTRAVENOUS

## 2021-12-07 MED ORDER — PROPOFOL 500 MG/50ML IV EMUL
INTRAVENOUS | Status: DC | PRN
  Administered 2021-12-07: 150 ug/kg/min via INTRAVENOUS

## 2021-12-07 NOTE — Op Note (Signed)
[facility] Gastroenterology Patient Name: [name] Procedure Date: 12/07/2021 11:32 AM MRN: [id number] Account #: [id number] Date of Birth: 01-31-1945 Admit Type: Outpatient Age: 76 Room: [facility] ENDO ROOM 1 Gender: Male Note Status: Finalized Instrument Name: Jasper Riling [id number] Procedure:             Colonoscopy Indications:           Iron deficiency anemia Providers:             Andrey Farmer MD, MD Medicines:             Monitored Anesthesia Care Complications:         No immediate complications. Procedure:             Pre-Anesthesia Assessment:                        - Prior to the procedure, a History and Physical was                         performed, and patient medications and allergies were                         reviewed. The patient is competent. The risks and                         benefits of the procedure and the sedation options and                         risks were discussed with the patient. All questions                         were answered and informed consent was obtained.                         Patient identification and proposed procedure were                         verified by the physician, the nurse, the                         anesthesiologist, the anesthetist and the technician                         in the endoscopy suite. Mental Status Examination:                         alert and oriented. Airway Examination: normal                         oropharyngeal airway and neck mobility. Respiratory                         Examination: clear to auscultation. CV Examination:                         normal. Prophylactic Antibiotics: The patient does not                         require prophylactic antibiotics. Prior  Anticoagulants: The patient has taken Eliquis                         (apixaban), last dose was 3 days prior to procedure.                         ASA Grade Assessment: III - A patient with  severe                         systemic disease. After reviewing the risks and                         benefits, the patient was deemed in satisfactory                         condition to undergo the procedure. The anesthesia                         plan was to use monitored anesthesia care (MAC).                         Immediately prior to administration of medications,                         the patient was re-assessed for adequacy to receive                         sedatives. The heart rate, respiratory rate, oxygen                         saturations, blood pressure, adequacy of pulmonary                         ventilation, and response to care were monitored                         throughout the procedure. The physical status of the                         patient was re-assessed after the procedure.                        After obtaining informed consent, the colonoscope was                         passed under direct vision. Throughout the procedure,                         the patient's blood pressure, pulse, and oxygen                         saturations were monitored continuously. The                         Colonoscope was introduced through the anus and                         advanced to the the cecum, identified by appendiceal  orifice and ileocecal valve. The colonoscopy was                         somewhat difficult due to poor endoscopic                         visualization and significant looping. Successful                         completion of the procedure was aided by applying                         abdominal pressure. The patient tolerated the                         procedure well. The quality of the bowel preparation                         was fair. The ileocecal valve, appendiceal orifice,                         and rectum were photographed. Findings:      The perianal and digital rectal examinations were normal.      Internal  hemorrhoids were found during retroflexion. The hemorrhoids       were Grade I (internal hemorrhoids that do not prolapse).      The exam was otherwise without abnormality on direct and retroflexion       views. Impression:            - Preparation of the colon was fair.                        - Internal hemorrhoids.                        - The examination was otherwise normal on direct and                         retroflexion views.                        - No specimens collected. Recommendation:        - Discharge patient to home.                        - Resume previous diet.                        - Resume Eliquis (apixaban) at prior dose today.                        - Await pathology results.                        - Repeat colonoscopy in 1 year because the bowel                         preparation was suboptimal.                        - Return to referring physician as  previously                         scheduled. Procedure Code(s):     --- Professional ---                        [phone], Colonoscopy, flexible; diagnostic, including                         collection of specimen(s) by brushing or washing, when                         performed (separate procedure) Diagnosis Code(s):     --- Professional ---                        K64.0, First degree hemorrhoids                        D50.9, Iron deficiency anemia, unspecified CPT copyright 2022 American Medical Association. All rights reserved. The codes documented in this report are preliminary and upon coder review may  be revised to meet current compliance requirements. Andrey Farmer MD, MD 12/07/2021 12:18:50 PM Number of Addenda: 0 Note Initiated On: 12/07/2021 11:32 AM Scope Withdrawal Time: 0 hours 5 minutes 8 seconds  Total Procedure Duration: 0 hours 9 minutes 59 seconds  Estimated Blood Loss:  Estimated blood loss: none.      [facility]

## 2021-12-07 NOTE — Op Note (Signed)
[facility] Patient Name: [name] Procedure Date: 12/07/2021 11:32 AM MRN: [id number] Account #: [id number] Date of Birth: 1945-11-24 Admit Type: Outpatient Age: 76 Room: [facility] ENDO ROOM 1 Gender: Male Note Status: Finalized Instrument Name: Upper Endoscope [id number] Procedure:             Upper GI endoscopy Indications:           Iron deficiency anemia Providers:             Andrey Farmer MD, MD Medicines:             Monitored Anesthesia Care Complications:         No immediate complications. Estimated blood loss:                         Minimal. Procedure:             Pre-Anesthesia Assessment:                        - Prior to the procedure, a History and Physical was                         performed, and patient medications and allergies were                         reviewed. The patient is competent. The risks and                         benefits of the procedure and the sedation options and                         risks were discussed with the patient. All questions                         were answered and informed consent was obtained.                         Patient identification and proposed procedure were                         verified by the physician, the nurse, the                         anesthesiologist, the anesthetist and the technician                         in the endoscopy suite. Mental Status Examination:                         alert and oriented. Airway Examination: normal                         oropharyngeal airway and neck mobility. Respiratory                         Examination: clear to auscultation. CV Examination:                         normal. Prophylactic Antibiotics: The patient does not  require prophylactic antibiotics. Prior                         Anticoagulants: The patient has taken Eliquis                         (apixaban), last dose was 3 days prior to procedure.                          ASA Grade Assessment: III - A patient with severe                         systemic disease. After reviewing the risks and                         benefits, the patient was deemed in satisfactory                         condition to undergo the procedure. The anesthesia                         plan was to use monitored anesthesia care (MAC).                         Immediately prior to administration of medications,                         the patient was re-assessed for adequacy to receive                         sedatives. The heart rate, respiratory rate, oxygen                         saturations, blood pressure, adequacy of pulmonary                         ventilation, and response to care were monitored                         throughout the procedure. The physical status of the                         patient was re-assessed after the procedure.                        After obtaining informed consent, the endoscope was                         passed under direct vision. Throughout the procedure,                         the patient's blood pressure, pulse, and oxygen                         saturations were monitored continuously. The Endoscope                         was introduced through the mouth, and advanced to the  second part of duodenum. The upper GI endoscopy was                         accomplished without difficulty. The patient tolerated                         the procedure well. Findings:      The examined esophagus was normal.      Multiple dispersed small erosions with no stigmata of recent bleeding       were found in the gastric antrum. Biopsies were taken with a cold       forceps for Helicobacter pylori testing. Estimated blood loss was       minimal.      The exam of the stomach was otherwise normal.      The examined duodenum was normal. Impression:            - Normal esophagus.                        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 Biopsied.                        - Normal examined duodenum. Recommendation:        - Discharge patient to home.                        - Resume previous diet.                        - Continue present medications.                        - Await pathology results.                        - Return to referring physician as previously                         scheduled. Procedure Code(s):     --- Professional ---                        [phone], Esophagogastroduodenoscopy, flexible,                         transoral; with biopsy, single or multiple Diagnosis Code(s):     --- Professional ---                        K31.89, Other diseases of stomach and duodenum                        D50.9, Iron deficiency anemia, unspecified CPT copyright 2022 American Medical Association. All rights reserved. The codes documented in this report are preliminary and upon coder review may  be revised to meet current compliance requirements. Andrey Farmer MD, MD 12/07/2021 12:15:23 PM Number of Addenda: 0 Note Initiated On: 12/07/2021 11:32 AM Estimated Blood Loss:  Estimated blood loss was minimal.      [facility]

## 2021-12-07 NOTE — Interval H&P Note (Signed)
History and Physical Interval Note:  12/07/2021 11:38 AM  [name]  has presented today for surgery, with the diagnosis of Chronic Anemia.  The various methods of treatment have been discussed with the patient and family. After consideration of risks, benefits and other options for treatment, the patient has consented to  Procedure(s): COLONOSCOPY WITH PROPOFOL (N/A) ESOPHAGOGASTRODUODENOSCOPY (EGD) WITH PROPOFOL (N/A) as a surgical intervention.  The patient's history has been reviewed, patient examined, no change in status, stable for surgery.  I have reviewed the patient's chart and labs.  Questions were answered to the patient's satisfaction.     Lesly Rubenstein  Ok to proceed with EGD/Colonoscopy

## 2021-12-07 NOTE — Anesthesia Preprocedure Evaluation (Addendum)
Anesthesia Evaluation  Patient identified by MRN, date of birth, ID band Patient awake    Reviewed: Allergy & Precautions, NPO status , Patient's Chart, lab work & pertinent test results  History of Anesthesia Complications Negative for: history of anesthetic complications  Airway Mallampati: III  TM Distance: >3 FB Neck ROM: Full    Dental  (+) Edentulous Upper, Edentulous Lower   Pulmonary shortness of breath and with exertion, neg sleep apnea, COPD,  COPD inhaler, Patient abstained from smoking.Not current smoker, former smoker Likely undiagnosed sleep apnea based on signs and symptom s   + rhonchi        Cardiovascular Exercise Tolerance: Good METShypertension, Pt. 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     Neuro/Psych negative neurological ROS  negative psych ROS   GI/Hepatic ,neg GERD  ,,(+)     (-) substance abuse    Endo/Other  diabetes, Oral Hypoglycemic Agents  Morbid obesity  Renal/GU Renal InsufficiencyRenal disease  negative genitourinary   Musculoskeletal negative musculoskeletal ROS (+)    Abdominal  (+) + obese  Peds negative pediatric ROS (+)  Hematology negative hematology ROS (+)   Anesthesia Other Findings Past Medical History: No date: Cancer ([place]) No date: COPD (chronic obstructive pulmonary disease) ([facility]) No date: Diabetes mellitus without complication ([facility]) No date: Hyperlipidemia No date:  Hypertension No date: Left thyroid nodule No date: Prostate CA ([place]) No date: Renal insufficiency  Reproductive/Obstetrics                             Anesthesia Physical Anesthesia Plan  ASA: 3  Anesthesia Plan: General   Post-op Pain Management: Minimal or no pain anticipated   Induction: Intravenous  PONV Risk Score and Plan: 2 and TIVA and Propofol infusion  Airway Management Planned: Nasal Cannula  Additional Equipment: None  Intra-op Plan:   Post-operative Plan:   Informed Consent: I have reviewed the patients History and Physical, chart, labs and discussed the procedure including the risks, benefits and alternatives for the proposed anesthesia with the patient or authorized representative who has indicated his/her understanding and acceptance.       Plan Discussed with: CRNA and Surgeon  Anesthesia Plan Comments:         Anesthesia Quick Evaluation

## 2021-12-07 NOTE — Interval H&P Note (Signed)
History and Physical Interval Note:  12/07/2021 11:38 AM  [name]  has presented today for surgery, with the diagnosis of Chronic Anemia.  The various methods of treatment have been discussed with the patient and family. After consideration of risks, benefits and other options for treatment, the patient has consented to  Procedure(s): COLONOSCOPY WITH PROPOFOL (N/A) ESOPHAGOGASTRODUODENOSCOPY (EGD) WITH PROPOFOL (N/A) as a surgical intervention.  The patient's history has been reviewed, patient examined, no change in status, stable for surgery.  I have reviewed the patient's chart and labs.  Questions were answered to the patient's satisfaction.     Lesly Rubenstein  Ok to proceed with colonoscopy

## 2021-12-07 NOTE — Transfer of Care (Signed)
Immediate Anesthesia Transfer of Care Note  Patient: [name]  Procedure(s) Performed: COLONOSCOPY WITH PROPOFOL ESOPHAGOGASTRODUODENOSCOPY (EGD) WITH PROPOFOL  Patient Location: PACU  Anesthesia Type:General  Level of Consciousness: drowsy  Airway & Oxygen Therapy: Patient Spontanous Breathing and Patient connected to nasal cannula oxygen  Post-op Assessment: Report given to RN and Post -op Vital signs reviewed and stable  Post vital signs: Reviewed and stable  Last Vitals:  Vitals Value Taken Time  BP 177/77 12/07/21 1221  Temp    Pulse 115 12/07/21 1223  Resp 23 12/07/21 1223  SpO2 98 % 12/07/21 1223  Vitals shown include unvalidated device data.  Last Pain:  Vitals:   12/07/21 1217  TempSrc:   PainSc: 0-No pain         Complications: No notable events documented.

## 2021-12-07 NOTE — H&P (Signed)
Outpatient short stay form Pre-procedure 12/07/2021  Justin Rubenstein, MD  Primary Physician: Juluis Pitch, MD  Reason for visit:  IDA  History of present illness:    76 y/o gentleman with history of obesity, DM II, and a. Fib on DOAC with last dose being 3 days ago. Last colonoscopy in 2020 with two Ta's. No significant family history of GI malignancies. No significant abdominal surgeries.    Current Facility-Administered Medications:    0.9 %  sodium chloride infusion, , Intravenous, Continuous, Mende Biswell, Hilton Cork, MD  Medications Prior to Admission  Medication Sig Dispense Refill Last Dose   Fluticasone-Salmeterol (ADVAIR) 250-50 MCG/DOSE AEPB INHALE 1 PUFF INTO THE LUNGS EVERY 12 HOURS   12/06/2021   furosemide (LASIX) 20 MG tablet TAKE 1 TABLET(20 MG) BY MOUTH EVERY DAY   12/06/2021   metFORMIN (GLUCOPHAGE) 850 MG tablet Take 850 mg by mouth 2 (two) times daily.   12/06/2021   sertraline (ZOLOFT) 100 MG tablet Take 100 mg by mouth daily.   12/06/2021   albuterol (PROVENTIL HFA;VENTOLIN HFA) 108 (90 Base) MCG/ACT inhaler Inhale 2 puffs into the lungs every 6 (six) hours as needed for wheezing or shortness of breath. 1 Inhaler 2 11/23/2021   amLODipine (NORVASC) 5 MG tablet Take 5 mg by mouth daily.   12/05/2021   apixaban (ELIQUIS) 5 MG TABS tablet Take 1 tablet (5 mg total) by mouth 2 (two) times daily. 60 tablet 6 12/03/2021   aspirin EC 81 MG tablet Take 81 mg by mouth daily.   07/21/2021   atorvastatin (LIPITOR) 40 MG tablet Take 40 mg by mouth daily.      hydrALAZINE (APRESOLINE) 50 MG tablet Take 50 mg by mouth 3 (three) times daily.   12/05/2021   losartan (COZAAR) 100 MG tablet Take 100 mg by mouth daily.   12/05/2021   metoprolol tartrate (LOPRESSOR) 100 MG tablet Take 50 mg by mouth 2 (two) times daily.   12/05/2021   sildenafil (REVATIO) 20 MG tablet Take 100 mg by mouth as needed.      SYMBICORT 80-4.5 MCG/ACT inhaler Inhale 2 puffs into the lungs 2 (two) times  daily.      tadalafil (CIALIS) 20 MG tablet Take 20 mg by mouth as needed.      traZODone (DESYREL) 100 MG tablet Take 100 mg by mouth at bedtime as needed for sleep.   11/30/2021     Allergies  Allergen Reactions   Penicillins Swelling   Eggs Or Egg-Derived Products Other (See Comments)     Past Medical History:  Diagnosis Date   Cancer ([place])    COPD (chronic obstructive pulmonary disease) ([place])    Diabetes mellitus without complication ([place])    Hyperlipidemia    Hypertension    Left thyroid nodule    Prostate CA ([place])    Renal insufficiency     Review of systems:  Otherwise negative.    Physical Exam  Gen: Alert, oriented. Appears stated age.  HEENT: PERRLA. Lungs: No respiratory distress CV: RRR Abd: soft, benign, no masses Ext: No edema    Planned procedures: Proceed with colonoscopy. The patient understands the nature of the planned procedure, indications, risks, alternatives and potential complications including but not limited to bleeding, infection, perforation, damage to internal organs and possible oversedation/side effects from anesthesia. The patient agrees and gives consent to proceed.  Please refer to procedure notes for findings, recommendations and patient disposition/instructions.     Justin Rubenstein, MD [facility]

## 2021-12-08 ENCOUNTER — Inpatient Hospital Stay: Payer: Medicare Other | Attending: Oncology | Admitting: Oncology

## 2021-12-08 ENCOUNTER — Encounter: Payer: Self-pay | Admitting: Oncology

## 2021-12-08 VITALS — BP 157/71 | HR 72 | Temp 97.0°F | Resp 18 | Wt 264.4 lb

## 2021-12-08 DIAGNOSIS — R5382 Chronic fatigue, unspecified: Secondary | ICD-10-CM

## 2021-12-08 DIAGNOSIS — D500 Iron deficiency anemia secondary to blood loss (chronic): Secondary | ICD-10-CM

## 2021-12-08 DIAGNOSIS — Z79899 Other long term (current) drug therapy: Secondary | ICD-10-CM | POA: Insufficient documentation

## 2021-12-08 DIAGNOSIS — D509 Iron deficiency anemia, unspecified: Secondary | ICD-10-CM | POA: Diagnosis not present

## 2021-12-08 DIAGNOSIS — Z87891 Personal history of nicotine dependence: Secondary | ICD-10-CM

## 2021-12-08 DIAGNOSIS — Z8546 Personal history of malignant neoplasm of prostate: Secondary | ICD-10-CM

## 2021-12-08 DIAGNOSIS — N184 Chronic kidney disease, stage 4 (severe): Secondary | ICD-10-CM | POA: Insufficient documentation

## 2021-12-08 NOTE — Assessment & Plan Note (Signed)
Previous labs were reviewed and discussed with patient.  Consistent with severe iron deficiency anemia. Options of oral iron supplementation versus IV Venofer treatments were reviewed and discussed with patient and her daughter. 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 Alternative option of proceed with IV Venofer treatments. I discussed about the potential risks including but not limited to allergic reactions/infusion reactions including anaphylactic reactions, phlebitis, high blood pressure, wheezing, SOB, skin rash, weight gain, leg swelling, headache, nausea and fatigue, etc. patient and daughter agree with IV Venofer.  Plan IV venofer weekly x 4

## 2021-12-08 NOTE — Anesthesia Postprocedure Evaluation (Signed)
Anesthesia Post Note  Patient: [name]  Procedure(s) Performed: COLONOSCOPY WITH PROPOFOL ESOPHAGOGASTRODUODENOSCOPY (EGD) WITH PROPOFOL  Patient location during evaluation: PACU Anesthesia Type: General Level of consciousness: awake and alert Pain management: pain level controlled Vital Signs Assessment: post-procedure vital signs reviewed and stable Respiratory status: spontaneous breathing, nonlabored ventilation and respiratory function stable Cardiovascular status: blood pressure returned to baseline and stable Postop Assessment: no apparent nausea or vomiting Anesthetic complications: no   No notable events documented.   Last Vitals:  Vitals:   12/07/21 1232 12/07/21 1250  BP:  (!) 199/100  Pulse:    Resp:    Temp:    SpO2: 100% 99%    Last Pain:  Vitals:   12/07/21 1250  TempSrc:   PainSc: 0-No pain                 [place] Ouch

## 2021-12-08 NOTE — Progress Notes (Signed)
Patient here to establish care for Anemia. Pt with history of prostate cancer.

## 2021-12-08 NOTE — Assessment & Plan Note (Signed)
Avoid nephrotoxins. Will check myeloma panel at the next visit. There might be a component of anemia secondary to chronic kidney disease.

## 2021-12-08 NOTE — Progress Notes (Signed)
Hematology/Oncology Consult note [phone] [phone]         Patient Care Team: Juluis Pitch, MD as PCP - General (Family Medicine)  REFERRING PROVIDER: Juluis Pitch, MD   ASSESSMENT & PLAN:   IDA (iron deficiency anemia) Previous labs were reviewed and discussed with patient.  Consistent with severe iron deficiency anemia. Options of oral iron supplementation versus IV Venofer treatments were reviewed and discussed with patient and her daughter. 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 Alternative option of proceed with IV Venofer treatments. I discussed about the potential risks including but not limited to allergic reactions/infusion reactions including anaphylactic reactions, phlebitis, high blood pressure, wheezing, SOB, skin rash, weight gain, leg swelling, headache, nausea and fatigue, etc. patient and daughter agree with IV Venofer.  Plan IV venofer weekly x 4    Chronic kidney disease (CKD), active medical management without dialysis, stage 4 (severe) (HCC) Avoid nephrotoxins. Will check myeloma panel at the next visit. There might be a component of anemia secondary to chronic kidney disease.   Orders Placed This Encounter  Procedures   CBC with Differential/Platelet    Standing Status:   Future    Standing Expiration Date:   12/09/2022   Iron and TIBC    Standing Status:   Future    Standing Expiration Date:   12/09/2022   Ferritin    Standing Status:   Future    Standing Expiration Date:   12/09/2022   Retic Panel    Standing Status:   Future    Standing Expiration Date:   12/09/2022   Follow-up in 3 months. All questions were answered. The patient knows to call the clinic with any problems, questions or concerns.  Earlie Server, MD, PhD [facility] Hematology Oncology 12/08/2021 '  CHIEF COMPLAINTS/REASON FOR VISIT:  Evaluation of iron  deficiency anemia  HISTORY OF PRESENTING ILLNESS:   AISON Oneal is a  76 y.o.  male with PMH listed below was seen in consultation at the request of  Juluis Pitch, MD  for evaluation of anemia. 11/23/2021, CBC showed hemoglobin 8.1, MCV 69.7, hematocrit 29.4, Iron panel showed saturation of 5%, TIBC 457, ferritin 28. 12/08/2021, status post EGD and colonoscopy please find use of erosive gastropathy with no stigmata of recent bleeding.  Biopsy showed very mild antral predominant chronic gastritis.  No significant intestinal metaplastic, dysplastic or glandular atrophic.  Immunohistochemical stain for H. pylori will be performed.  Results will be issued in an addendum. Patient was accompanied by her daughter today. She reports feeling okay.  Chronic fatigue, unchanged. Patient denies any black stool or bright red blood in the stool.  History of prostate cancer, status post brachytherapy with rising PSA.  He follows up with [facility].  MEDICAL HISTORY:  Past Medical History:  Diagnosis Date   Cancer [facility])    COPD (chronic obstructive pulmonary disease) ([place])    Diabetes mellitus without complication ([place])    Hyperlipidemia    Hypertension    Left thyroid nodule    Prostate CA ([place])    Renal insufficiency     SURGICAL HISTORY: Past Surgical History:  Procedure Laterality Date   CARDIOVERSION N/A 11/26/2020   Procedure: CARDIOVERSION;  Surgeon: Corey Skains, MD;  Location: [facility] ORS;  Service: Cardiovascular;  Laterality: N/A;   COLONOSCOPY WITH PROPOFOL     COLONOSCOPY WITH PROPOFOL N/A 08/16/2018   Procedure: COLONOSCOPY WITH PROPOFOL;  Surgeon: Lollie Sails, MD;  Location: [facility] ENDOSCOPY;  Service: Endoscopy;  Laterality: N/A;   COLONOSCOPY WITH PROPOFOL N/A 12/07/2021   Procedure: COLONOSCOPY WITH PROPOFOL;  Surgeon: Lesly Rubenstein, MD;  Location: [facility] ENDOSCOPY;  Service: Endoscopy;  Laterality: N/A;   ESOPHAGOGASTRODUODENOSCOPY (EGD) WITH PROPOFOL N/A  12/07/2021   Procedure: ESOPHAGOGASTRODUODENOSCOPY (EGD) WITH PROPOFOL;  Surgeon: Lesly Rubenstein, MD;  Location: [facility] ENDOSCOPY;  Service: Endoscopy;  Laterality: N/A;   insertion of radiation seeds     TEE WITHOUT CARDIOVERSION N/A 11/26/2020   Procedure: TRANSESOPHAGEAL ECHOCARDIOGRAM (TEE);  Surgeon: Corey Skains, MD;  Location: [facility] ORS;  Service: Cardiovascular;  Laterality: N/A;    SOCIAL HISTORY: Social History   Socioeconomic History   Marital status: Married    Spouse name: Not on file   Number of children: Not on file   Years of education: Not on file   Highest education level: Not on file  Occupational History   Not on file  Tobacco Use   Smoking status: Former    Packs/day: 1.50    Years: 30.00    Total pack years: 45.00    Types: Cigarettes    Quit date: 02/01/1999    Years since quitting: 22.8   Smokeless tobacco: Never  Vaping Use   Vaping Use: Never used  Substance and Sexual Activity   Alcohol use: Not Currently    Comment: 15 years ago   Drug use: Never   Sexual activity: Not on file  Other Topics Concern   Not on file  Social History Narrative   Not on file   Social Determinants of Health   Financial Resource Strain: Not on file  Food Insecurity: Not on file  Transportation Needs: Not on file  Physical Activity: Not on file  Stress: Not on file  Social Connections: Not on file  Intimate Partner Violence: Not on file    FAMILY HISTORY: Family History  Problem Relation Age of Onset   Diabetes Mother    Hypertension Father     ALLERGIES:  is allergic to penicillins and eggs or egg-derived products.  MEDICATIONS:  Current Outpatient Medications  Medication Sig Dispense Refill   albuterol (PROVENTIL HFA;VENTOLIN HFA) 108 (90 Base) MCG/ACT inhaler Inhale 2 puffs into the lungs every 6 (six) hours as needed for wheezing or shortness of breath. 1 Inhaler 2   amLODipine (NORVASC) 5 MG tablet Take 5 mg by mouth daily.     apixaban  (ELIQUIS) 5 MG TABS tablet Take 1 tablet (5 mg total) by mouth 2 (two) times daily. 60 tablet 6   aspirin EC 81 MG tablet Take 81 mg by mouth daily.     atorvastatin (LIPITOR) 40 MG tablet Take 40 mg by mouth daily.     Fluticasone-Salmeterol (ADVAIR) 250-50 MCG/DOSE AEPB INHALE 1 PUFF INTO THE LUNGS EVERY 12 HOURS     furosemide (LASIX) 20 MG tablet TAKE 1 TABLET(20 MG) BY MOUTH EVERY DAY     hydrALAZINE (APRESOLINE) 50 MG tablet Take 50 mg by mouth 3 (three) times daily.     losartan (COZAAR) 100 MG tablet Take 100 mg by mouth daily.     metFORMIN (GLUCOPHAGE) 850 MG tablet Take 850 mg by mouth 2 (two) times daily.     metoprolol tartrate (LOPRESSOR) 100 MG tablet Take 50 mg by mouth 2 (two) times daily.     sertraline (ZOLOFT) 100 MG tablet Take 100 mg by mouth daily.     sildenafil (REVATIO) 20 MG tablet  Take 100 mg by mouth as needed.     SYMBICORT 80-4.5 MCG/ACT inhaler Inhale 2 puffs into the lungs 2 (two) times daily.     tadalafil (CIALIS) 20 MG tablet Take 20 mg by mouth as needed.     traZODone (DESYREL) 100 MG tablet Take 100 mg by mouth at bedtime as needed for sleep.     No current facility-administered medications for this visit.    Review of Systems  Constitutional:  Negative for appetite change, chills, fever and unexpected weight change.  HENT:   Negative for hearing loss and voice change.   Eyes:  Negative for eye problems and icterus.  Respiratory:  Negative for chest tightness, cough and shortness of breath.   Cardiovascular:  Negative for chest pain and leg swelling.  Gastrointestinal:  Negative for abdominal distention and abdominal pain.  Endocrine: Negative for hot flashes.  Genitourinary:  Negative for difficulty urinating, dysuria and frequency.   Musculoskeletal:  Negative for arthralgias.  Skin:  Negative for itching and rash.  Neurological:  Negative for light-headedness and numbness.  Hematological:  Negative for adenopathy. Does not bruise/bleed easily.   Psychiatric/Behavioral:  Negative for confusion.    PHYSICAL EXAMINATION: ECOG PERFORMANCE STATUS: 1 - Symptomatic but completely ambulatory Vitals:   12/08/21 1005  BP: (!) 157/71  Pulse: 72  Resp: 18  Temp: (!) 97 F (36.1 C)   Filed Weights   12/08/21 1005  Weight: 264 lb 6.4 oz (119.9 kg)    Physical Exam Constitutional:      General: He is not in acute distress.    Appearance: He is obese.  HENT:     Head: Normocephalic and atraumatic.  Eyes:     General: No scleral icterus. Cardiovascular:     Rate and Rhythm: Normal rate.  Pulmonary:     Effort: Pulmonary effort is normal. No respiratory distress.     Breath sounds: No wheezing.  Abdominal:     General: Bowel sounds are normal. There is no distension.     Palpations: Abdomen is soft.  Musculoskeletal:        General: No deformity. Normal range of motion.     Cervical back: Normal range of motion and neck supple.  Skin:    General: Skin is warm and dry.     Findings: No erythema or rash.  Neurological:     Mental Status: He is alert and oriented to person, place, and time. Mental status is at baseline.     Cranial Nerves: No cranial nerve deficit.  Psychiatric:        Mood and Affect: Mood normal.     LABORATORY DATA:  I have reviewed the data as listed    Latest Ref Rng & Units 01/31/2018    4:28 PM  CBC  WBC 4.0 - 10.5 K/uL 14.0   Hemoglobin 13.0 - 17.0 g/dL 12.3   Hematocrit 39.0 - 52.0 % 41.1   Platelets 150 - 400 K/uL 272       Latest Ref Rng & Units 01/31/2018    4:28 PM  CMP  Glucose 70 - 99 mg/dL 171   BUN 8 - 23 mg/dL 17   Creatinine 0.61 - 1.24 mg/dL 1.51   Sodium 135 - 145 mmol/L 135   Potassium 3.5 - 5.1 mmol/L 3.9   Chloride 98 - 111 mmol/L 99   CO2 22 - 32 mmol/L 27   Calcium 8.9 - 10.3 mg/dL 8.9       RADIOGRAPHIC STUDIES:  I have personally reviewed the radiological images as listed and agreed with the findings in the report. No results found.

## 2021-12-09 LAB — SURGICAL PATHOLOGY

## 2021-12-15 ENCOUNTER — Inpatient Hospital Stay: Payer: Medicare Other | Attending: Oncology

## 2021-12-15 VITALS — BP 150/68 | HR 66 | Temp 97.5°F | Resp 18

## 2021-12-15 DIAGNOSIS — D509 Iron deficiency anemia, unspecified: Secondary | ICD-10-CM | POA: Insufficient documentation

## 2021-12-15 DIAGNOSIS — N184 Chronic kidney disease, stage 4 (severe): Secondary | ICD-10-CM | POA: Insufficient documentation

## 2021-12-15 DIAGNOSIS — D500 Iron deficiency anemia secondary to blood loss (chronic): Secondary | ICD-10-CM

## 2021-12-15 MED ORDER — SODIUM CHLORIDE 0.9 % IV SOLN
Freq: Once | INTRAVENOUS | Status: AC
  Filled 2021-12-15: qty 250

## 2021-12-15 MED ORDER — SODIUM CHLORIDE 0.9 % IV SOLN
200.0000 mg | Freq: Once | INTRAVENOUS | Status: AC
  Administered 2021-12-15: 200 mg via INTRAVENOUS
  Filled 2021-12-15: qty 200

## 2021-12-22 ENCOUNTER — Inpatient Hospital Stay: Payer: Medicare Other

## 2021-12-22 VITALS — BP 129/67 | HR 81 | Temp 97.3°F | Resp 18

## 2021-12-22 DIAGNOSIS — D500 Iron deficiency anemia secondary to blood loss (chronic): Secondary | ICD-10-CM

## 2021-12-22 DIAGNOSIS — D509 Iron deficiency anemia, unspecified: Secondary | ICD-10-CM | POA: Diagnosis not present

## 2021-12-22 MED ORDER — SODIUM CHLORIDE 0.9 % IV SOLN
200.0000 mg | Freq: Once | INTRAVENOUS | Status: AC
  Administered 2021-12-22: 200 mg via INTRAVENOUS
  Filled 2021-12-22: qty 200

## 2021-12-22 MED ORDER — SODIUM CHLORIDE 0.9 % IV SOLN
Freq: Once | INTRAVENOUS | Status: AC
  Filled 2021-12-22: qty 250

## 2021-12-28 MED FILL — Iron Sucrose Inj 20 MG/ML (Fe Equiv): INTRAVENOUS | Qty: 10 | Status: AC

## 2021-12-29 ENCOUNTER — Inpatient Hospital Stay: Payer: Medicare Other

## 2021-12-29 VITALS — BP 137/57 | HR 87 | Temp 95.0°F

## 2021-12-29 DIAGNOSIS — D509 Iron deficiency anemia, unspecified: Secondary | ICD-10-CM | POA: Diagnosis not present

## 2021-12-29 DIAGNOSIS — D500 Iron deficiency anemia secondary to blood loss (chronic): Secondary | ICD-10-CM

## 2021-12-29 MED ORDER — SODIUM CHLORIDE 0.9 % IV SOLN
Freq: Once | INTRAVENOUS | Status: AC
  Filled 2021-12-29: qty 250

## 2021-12-29 MED ORDER — SODIUM CHLORIDE 0.9 % IV SOLN
200.0000 mg | Freq: Once | INTRAVENOUS | Status: AC
  Administered 2021-12-29: 200 mg via INTRAVENOUS
  Filled 2021-12-29: qty 200

## 2021-12-29 MED ORDER — SODIUM CHLORIDE 0.9% FLUSH
10.0000 mL | Freq: Once | INTRAVENOUS | Status: AC | PRN
  Administered 2021-12-29: 10 mL
  Filled 2021-12-29: qty 10

## 2021-12-29 NOTE — Patient Instructions (Signed)

## 2021-12-29 NOTE — Progress Notes (Signed)
Patient Venofer tolerated infusion well, no questions/concerns voiced. Monitored 30 min post transfusion. Patient stable at discharge. VSS. AVS given.

## 2022-01-04 MED FILL — Iron Sucrose Inj 20 MG/ML (Fe Equiv): INTRAVENOUS | Qty: 10 | Status: AC

## 2022-01-05 ENCOUNTER — Inpatient Hospital Stay: Payer: Medicare Other

## 2022-01-05 VITALS — BP 146/78 | HR 68 | Temp 97.8°F | Resp 18

## 2022-01-05 DIAGNOSIS — D500 Iron deficiency anemia secondary to blood loss (chronic): Secondary | ICD-10-CM

## 2022-01-05 DIAGNOSIS — D509 Iron deficiency anemia, unspecified: Secondary | ICD-10-CM | POA: Diagnosis not present

## 2022-01-05 MED ORDER — SODIUM CHLORIDE 0.9 % IV SOLN
200.0000 mg | Freq: Once | INTRAVENOUS | Status: AC
  Administered 2022-01-05: 200 mg via INTRAVENOUS
  Filled 2022-01-05: qty 200

## 2022-01-05 MED ORDER — SODIUM CHLORIDE 0.9 % IV SOLN
Freq: Once | INTRAVENOUS | Status: AC
  Filled 2022-01-05: qty 250

## 2022-01-05 NOTE — Patient Instructions (Signed)

## 2022-03-03 ENCOUNTER — Telehealth: Payer: Self-pay

## 2022-03-03 NOTE — Telephone Encounter (Signed)
He has not been seen by me, last followed at [facility] for prostate cancer f/u in 2022 but does not appear to have followed up.    Recommend apt locally with me vs. [facility] to discuss and restage in light of rising PSA.

## 2022-03-03 NOTE — Telephone Encounter (Signed)
Received fax from [facility] with patient's PSA of 8.2 on 02/21/22 (this is in care everywhere also), PSA was 6.1 on 10/04/21 per their record. Patient was advised to contact us on what we recommend based on these results.

## 2022-03-03 NOTE — Telephone Encounter (Signed)
Left message to call back to discuss.

## 2022-03-03 NOTE — Telephone Encounter (Signed)
Patient returned call and said he would like to follow up with Dr. Erlene Quan. Appointment scheduled for 03/09/22.

## 2022-03-09 ENCOUNTER — Ambulatory Visit (INDEPENDENT_AMBULATORY_CARE_PROVIDER_SITE_OTHER): Payer: Medicare Other | Admitting: Urology

## 2022-03-09 VITALS — BP 149/72 | HR 79 | Ht 66.0 in | Wt 264.4 lb

## 2022-03-09 DIAGNOSIS — C61 Malignant neoplasm of prostate: Secondary | ICD-10-CM | POA: Diagnosis not present

## 2022-03-09 DIAGNOSIS — R972 Elevated prostate specific antigen [PSA]: Secondary | ICD-10-CM | POA: Diagnosis not present

## 2022-03-09 NOTE — Progress Notes (Signed)
Haze Rushing Plume,acting as a scribe for [name] Espy, MD.,have documented all relevant documentation on the behalf of [name] Espy, MD,as directed by  [name] Espy, MD while in the presence of [name] Espy, MD.  03/09/2022 11:52 AM   Cherlynn June [birth year] [id number]  Referring provider: Juluis Pitch, MD [phone] [place],   [id number]  Chief Complaint  Patient presents with   Elevated PSA    HPI: 77 year-old male with a personal history of prostate cancer who returns today for a follow up of rising PSA.   He was last seen by me in 03/2020. At that point in time, was being seen and evaluated for elevated rising PSA. His test was brachytherapy. He was diagnosed with prostate cancer, Gleason 4+3, T2 in 2011. He was treated with brachytherapy in 11/2009. His nadir appears to be 0.11.   He underwent a PSMA PET scan that indicated focal activity in the left prostate gland that could represent a focal recurrence. He was referred to [facility] at that time for evaluation by Melvern Sample. He discussed whether he was a candidate for salvage therapy such as HIFU or cryotherapy. He was not deemed a candidate. He was also evaluated by radiation oncology and felt that he could receive no additional local focal therapy. The plan was to manage him conservatively and initiate androgen deprivation therapy in the setting of symptoms and/or evidence of metastatic disease. It appears that he continued to follow up at [facility] in the interim as PSA continues to rise.   His most recent PSA was 8.2 on 02/21/2022. Prior to that, he had one over a year ago and it was 6.1. Essentially, his PSA double time is still greater than a year.   PSA: 0.11 04/2014 0.28 03/2015  0.51 05/2016 0.96 07/2017 1.48 01/2018 1.4   06/2018 1.6   12/2018 2.6   12/2019 3.3   03/2020 4.0   08/2020 4.89 11/2020 5.92 08/2021 6.1   09/2021 8.2   02/2022  PMH: Past Medical History:  Diagnosis Date   Cancer  ([place])    COPD (chronic obstructive pulmonary disease) ([place])    Diabetes mellitus without complication ([place])    Hyperlipidemia    Hypertension    Left thyroid nodule    Prostate CA [facility])    Renal insufficiency     Surgical History: Past Surgical History:  Procedure Laterality Date   CARDIOVERSION N/A 11/26/2020   Procedure: CARDIOVERSION;  Surgeon: Corey Skains, MD;  Location: [facility] ORS;  Service: Cardiovascular;  Laterality: N/A;   COLONOSCOPY WITH PROPOFOL     COLONOSCOPY WITH PROPOFOL N/A 08/16/2018   Procedure: COLONOSCOPY WITH PROPOFOL;  Surgeon: Lollie Sails, MD;  Location: [facility] ENDOSCOPY;  Service: Endoscopy;  Laterality: N/A;   COLONOSCOPY WITH PROPOFOL N/A 12/07/2021   Procedure: COLONOSCOPY WITH PROPOFOL;  Surgeon: Lesly Rubenstein, MD;  Location: [facility] ENDOSCOPY;  Service: Endoscopy;  Laterality: N/A;   ESOPHAGOGASTRODUODENOSCOPY (EGD) WITH PROPOFOL N/A 12/07/2021   Procedure: ESOPHAGOGASTRODUODENOSCOPY (EGD) WITH PROPOFOL;  Surgeon: Lesly Rubenstein, MD;  Location: [facility] ENDOSCOPY;  Service: Endoscopy;  Laterality: N/A;   insertion of radiation seeds     TEE WITHOUT CARDIOVERSION N/A 11/26/2020   Procedure: TRANSESOPHAGEAL ECHOCARDIOGRAM (TEE);  Surgeon: Corey Skains, MD;  Location: [facility] ORS;  Service: Cardiovascular;  Laterality: N/A;    Home Medications:  Allergies as of 03/09/2022       Reactions   Penicillins Swelling   Eggs Or Egg-derived Products Other (  See Comments)        Medication List        Accurate as of March 09, 2022 11:52 AM. If you have any questions, ask your nurse or doctor.          albuterol 108 (90 Base) MCG/ACT inhaler Commonly known as: VENTOLIN HFA Inhale 2 puffs into the lungs every 6 (six) hours as needed for wheezing or shortness of breath.   amLODipine 5 MG tablet Commonly known as: NORVASC Take 5 mg by mouth daily.   apixaban 5 MG Tabs tablet Commonly known as: ELIQUIS Take 1 tablet (5 mg total) by  mouth 2 (two) times daily.   aspirin EC 81 MG tablet Take 81 mg by mouth daily.   atorvastatin 40 MG tablet Commonly known as: LIPITOR Take 40 mg by mouth daily.   Fluticasone-Salmeterol 250-50 MCG/DOSE Aepb Commonly known as: ADVAIR INHALE 1 PUFF INTO THE LUNGS EVERY 12 HOURS   furosemide 20 MG tablet Commonly known as: LASIX TAKE 1 TABLET(20 MG) BY MOUTH EVERY DAY   hydrALAZINE 50 MG tablet Commonly known as: APRESOLINE Take 50 mg by mouth 3 (three) times daily.   losartan 100 MG tablet Commonly known as: COZAAR Take 100 mg by mouth daily.   metFORMIN 850 MG tablet Commonly known as: GLUCOPHAGE Take 850 mg by mouth 2 (two) times daily.   metoprolol tartrate 100 MG tablet Commonly known as: LOPRESSOR Take 50 mg by mouth 2 (two) times daily.   sertraline 100 MG tablet Commonly known as: ZOLOFT Take 100 mg by mouth daily.   sildenafil 20 MG tablet Commonly known as: REVATIO Take 100 mg by mouth as needed.   Symbicort 80-4.5 MCG/ACT inhaler Generic drug: budesonide-formoterol Inhale 2 puffs into the lungs 2 (two) times daily.   tadalafil 20 MG tablet Commonly known as: CIALIS Take 20 mg by mouth as needed.   traZODone 100 MG tablet Commonly known as: DESYREL Take 100 mg by mouth at bedtime as needed for sleep.        Allergies:  Allergies  Allergen Reactions   Penicillins Swelling   Eggs Or Egg-Derived Products Other (See Comments)    Family History: Family History  Problem Relation Age of Onset   Diabetes Mother    Hypertension Father     Social History:  reports that he quit smoking about 23 years ago. His smoking use included cigarettes. He has a 45.00 pack-year smoking history. He has never used smokeless tobacco. He reports that he does not currently use alcohol. He reports that he does not use drugs.   Physical Exam: BP (!) 149/72   Pulse 79   Ht '5\' 6"'$  (1.676 m)   Wt 264 lb 6 oz (119.9 kg)   BMI 42.67 kg/m   Constitutional:  Alert  and oriented, No acute distress.  Companied by his wife today. HEENT: [place] AT, moist mucus membranes.  Trachea midline, no masses. Neurologic: Grossly intact, no focal deficits, moving all 4 extremities. Psychiatric: Normal mood and affect.   Assessment & Plan:    1. Prostate cancer - Repeat PET scan to see if he has developed any interval metastatic disease and would be a candidate for ADT.  -Currently is asymptomatic which is reassuring -Fairly slow doubling time - If there is no evidence of metastatic disease, we will continue watchful waiting with which she is agreeable -To call with PET scan, if interval development of metastatic disease, will have him return to the office.  Otherwise,  we will have him follow-up next year with another PSA.  He is agreeable this plan.   Return for PET scan.  I have reviewed the above documentation for accuracy and completeness, and I agree with the above.   [name] Espy, MD   [facility] [address] [place], [place] [address] [phone]  I spent 31 total minutes on the day of the encounter including pre-visit review of the medical record, face-to-face time with the patient, and post visit ordering of labs/imaging/tests.  [facility]ceph chart review of urology and oncology records from [place] in addition to face-to-face time today.

## 2022-03-10 ENCOUNTER — Inpatient Hospital Stay: Payer: Medicare Other | Attending: Oncology

## 2022-03-10 DIAGNOSIS — D509 Iron deficiency anemia, unspecified: Secondary | ICD-10-CM | POA: Diagnosis present

## 2022-03-10 DIAGNOSIS — N184 Chronic kidney disease, stage 4 (severe): Secondary | ICD-10-CM | POA: Diagnosis present

## 2022-03-10 DIAGNOSIS — D500 Iron deficiency anemia secondary to blood loss (chronic): Secondary | ICD-10-CM

## 2022-03-10 LAB — IRON AND TIBC
Iron: 38 ug/dL — ABNORMAL LOW (ref 45–182)
Saturation Ratios: 10 % — ABNORMAL LOW (ref 17.9–39.5)
TIBC: 391 ug/dL (ref 250–450)
UIBC: 353 ug/dL

## 2022-03-10 LAB — RETIC PANEL
Immature Retic Fract: 22.2 % — ABNORMAL HIGH (ref 2.3–15.9)
RBC.: 4.44 MIL/uL (ref 4.22–5.81)
Retic Count, Absolute: 78.6 10*3/uL (ref 19.0–186.0)
Retic Ct Pct: 1.8 % (ref 0.4–3.1)
Reticulocyte Hemoglobin: 25.5 pg — ABNORMAL LOW (ref >27.9)

## 2022-03-10 LAB — CBC WITH DIFFERENTIAL/PLATELET
Abs Immature Granulocytes: 0.05 10*3/uL (ref 0.00–0.07)
Basophils Absolute: 0.1 10*3/uL (ref 0.0–0.1)
Basophils Relative: 1 %
Eosinophils Absolute: 0.2 10*3/uL (ref 0.0–0.5)
Eosinophils Relative: 2 %
HCT: 35.8 % — ABNORMAL LOW (ref 39.0–52.0)
Hemoglobin: 10.6 g/dL — ABNORMAL LOW (ref 13.0–17.0)
Immature Granulocytes: 1 %
Lymphocytes Relative: 15 %
Lymphs Abs: 1.4 10*3/uL (ref 0.7–4.0)
MCH: 23.3 pg — ABNORMAL LOW (ref 26.0–34.0)
MCHC: 29.6 g/dL — ABNORMAL LOW (ref 30.0–36.0)
MCV: 78.9 fL — ABNORMAL LOW (ref 80.0–100.0)
Monocytes Absolute: 1 10*3/uL (ref 0.1–1.0)
Monocytes Relative: 10 %
Neutro Abs: 7.1 10*3/uL (ref 1.7–7.7)
Neutrophils Relative %: 71 %
Platelets: 231 10*3/uL (ref 150–400)
RBC: 4.54 MIL/uL (ref 4.22–5.81)
RDW: 19.9 % — ABNORMAL HIGH (ref 11.5–15.5)
WBC: 9.8 10*3/uL (ref 4.0–10.5)
nRBC: 0 % (ref 0.0–0.2)

## 2022-03-10 LAB — FERRITIN: Ferritin: 31 ng/mL (ref 24–336)

## 2022-03-11 ENCOUNTER — Encounter: Payer: Medicare Other | Attending: Physician Assistant | Admitting: Physician Assistant

## 2022-03-11 DIAGNOSIS — I129 Hypertensive chronic kidney disease with stage 1 through stage 4 chronic kidney disease, or unspecified chronic kidney disease: Secondary | ICD-10-CM | POA: Diagnosis not present

## 2022-03-11 DIAGNOSIS — E11628 Type 2 diabetes mellitus with other skin complications: Secondary | ICD-10-CM | POA: Insufficient documentation

## 2022-03-11 DIAGNOSIS — I480 Paroxysmal atrial fibrillation: Secondary | ICD-10-CM | POA: Diagnosis not present

## 2022-03-11 DIAGNOSIS — I87333 Chronic venous hypertension (idiopathic) with ulcer and inflammation of bilateral lower extremity: Secondary | ICD-10-CM | POA: Diagnosis present

## 2022-03-11 DIAGNOSIS — Z7901 Long term (current) use of anticoagulants: Secondary | ICD-10-CM | POA: Insufficient documentation

## 2022-03-11 DIAGNOSIS — Z87891 Personal history of nicotine dependence: Secondary | ICD-10-CM | POA: Insufficient documentation

## 2022-03-11 DIAGNOSIS — E1122 Type 2 diabetes mellitus with diabetic chronic kidney disease: Secondary | ICD-10-CM | POA: Insufficient documentation

## 2022-03-11 DIAGNOSIS — J449 Chronic obstructive pulmonary disease, unspecified: Secondary | ICD-10-CM | POA: Insufficient documentation

## 2022-03-11 DIAGNOSIS — Z09 Encounter for follow-up examination after completed treatment for conditions other than malignant neoplasm: Secondary | ICD-10-CM | POA: Insufficient documentation

## 2022-03-11 DIAGNOSIS — E1140 Type 2 diabetes mellitus with diabetic neuropathy, unspecified: Secondary | ICD-10-CM | POA: Diagnosis not present

## 2022-03-11 DIAGNOSIS — I890 Lymphedema, not elsewhere classified: Secondary | ICD-10-CM | POA: Diagnosis not present

## 2022-03-11 DIAGNOSIS — N184 Chronic kidney disease, stage 4 (severe): Secondary | ICD-10-CM | POA: Diagnosis not present

## 2022-03-15 ENCOUNTER — Inpatient Hospital Stay: Payer: Medicare Other | Attending: Oncology | Admitting: Oncology

## 2022-03-15 ENCOUNTER — Encounter: Payer: Self-pay | Admitting: Oncology

## 2022-03-15 ENCOUNTER — Inpatient Hospital Stay: Payer: Medicare Other

## 2022-03-15 VITALS — BP 140/60 | HR 80 | Resp 18

## 2022-03-15 VITALS — BP 145/58 | HR 84 | Temp 97.5°F | Resp 18 | Wt 265.1 lb

## 2022-03-15 DIAGNOSIS — N184 Chronic kidney disease, stage 4 (severe): Secondary | ICD-10-CM | POA: Diagnosis not present

## 2022-03-15 DIAGNOSIS — D509 Iron deficiency anemia, unspecified: Secondary | ICD-10-CM | POA: Diagnosis present

## 2022-03-15 DIAGNOSIS — D500 Iron deficiency anemia secondary to blood loss (chronic): Secondary | ICD-10-CM

## 2022-03-15 MED ORDER — SODIUM CHLORIDE 0.9 % IV SOLN
Freq: Once | INTRAVENOUS | Status: AC
  Filled 2022-03-15: qty 250

## 2022-03-15 MED ORDER — SODIUM CHLORIDE 0.9 % IV SOLN
200.0000 mg | Freq: Once | INTRAVENOUS | Status: AC
  Administered 2022-03-15: 200 mg via INTRAVENOUS
  Filled 2022-03-15: qty 200

## 2022-03-15 NOTE — Progress Notes (Signed)
Hematology/Oncology Consult note [phone] [phone]         Patient Care Team: Juluis Pitch, MD as PCP - General (Family Medicine)  REFERRING PROVIDER: Juluis Pitch, MD   ASSESSMENT & PLAN:   IDA (iron deficiency anemia) Hb and iron panel have improved, still iron deficienct.  Recommend IV venofer weekly x 4    Chronic kidney disease (CKD), active medical management without dialysis, stage 4 (severe) (HCC) Avoid nephrotoxins. There might be a component of anemia secondary to chronic kidney disease.   Orders Placed This Encounter  Procedures   CBC with Differential ([facility] Only)    Standing Status:   Future    Standing Expiration Date:   03/15/2023   Iron and TIBC    Standing Status:   Future    Standing Expiration Date:   03/15/2023   Ferritin    Standing Status:   Future    Standing Expiration Date:   03/15/2023   Follow-up in 3 months. All questions were answered. The patient knows to call the clinic with any problems, questions or concerns.  Earlie Server, MD, PhD [facility] Hematology Oncology 03/15/2022 '  CHIEF COMPLAINTS/REASON FOR VISIT:  Evaluation of iron deficiency anemia  HISTORY OF PRESENTING ILLNESS:   [name] is a  77 y.o.  male with PMH listed below was seen in consultation at the request of  Juluis Pitch, MD  for evaluation of anemia. 11/23/2021, CBC showed hemoglobin 8.1, MCV 69.7, hematocrit 29.4, Iron panel showed saturation of 5%, TIBC 457, ferritin 28. 12/08/2021, status post EGD and colonoscopy please find use of erosive gastropathy with no stigmata of recent bleeding.  Biopsy showed very mild antral predominant chronic gastritis.  No significant intestinal metaplastic, dysplastic or glandular atrophic.  Immunohistochemical stain for H. pylori will be performed.  Results will be issued in an addendum. Patient was accompanied by her daughter today. She reports feeling okay.  Chronic fatigue,  unchanged. Patient denies any black stool or bright red blood in the stool.  History of prostate cancer, status post brachytherapy with rising PSA.  He follows up with  urology.  INTERVAL HISTORY [name] is a 77 y.o. male who has above history reviewed by me today presents for follow up visit for iron deficiency anemia.  He tolerates IV venofer. Fatigue has improved.   Urology Dr. Erlene Quan plans to repeat PMSA  MEDICAL HISTORY:  Past Medical History:  Diagnosis Date   Cancer [facility])    COPD (chronic obstructive pulmonary disease) ([place])    Diabetes mellitus without complication ([place])    Hyperlipidemia    Hypertension    Left thyroid nodule    Prostate CA ([place])    Renal insufficiency     SURGICAL HISTORY: Past Surgical History:  Procedure Laterality Date   CARDIOVERSION N/A 11/26/2020   Procedure: CARDIOVERSION;  Surgeon: Corey Skains, MD;  Location: [facility] ORS;  Service: Cardiovascular;  Laterality: N/A;   COLONOSCOPY WITH PROPOFOL     COLONOSCOPY WITH PROPOFOL N/A 08/16/2018   Procedure: COLONOSCOPY WITH PROPOFOL;  Surgeon: Lollie Sails, MD;  Location: [facility] ENDOSCOPY;  Service: Endoscopy;  Laterality: N/A;   COLONOSCOPY WITH PROPOFOL N/A 12/07/2021   Procedure: COLONOSCOPY WITH PROPOFOL;  Surgeon: Lesly Rubenstein, MD;  Location: [facility] ENDOSCOPY;  Service: Endoscopy;  Laterality: N/A;   ESOPHAGOGASTRODUODENOSCOPY (EGD) WITH PROPOFOL N/A 12/07/2021   Procedure: ESOPHAGOGASTRODUODENOSCOPY (EGD) WITH PROPOFOL;  Surgeon: Lesly Rubenstein, MD;  Location: [facility] ENDOSCOPY;  Service: Endoscopy;  Laterality:  N/A;   insertion of radiation seeds     TEE WITHOUT CARDIOVERSION N/A 11/26/2020   Procedure: TRANSESOPHAGEAL ECHOCARDIOGRAM (TEE);  Surgeon: Corey Skains, MD;  Location: [facility] ORS;  Service: Cardiovascular;  Laterality: N/A;    SOCIAL HISTORY: Social History   Socioeconomic History   Marital status: Married    Spouse name: Not on file   Number of  children: Not on file   Years of education: Not on file   Highest education level: Not on file  Occupational History   Not on file  Tobacco Use   Smoking status: Former    Packs/day: 1.50    Years: 30.00    Total pack years: 45.00    Types: Cigarettes    Quit date: 02/01/1999    Years since quitting: 23.1   Smokeless tobacco: Never  Vaping Use   Vaping Use: Never used  Substance and Sexual Activity   Alcohol use: Not Currently    Comment: 15 years ago   Drug use: Never   Sexual activity: Not on file  Other Topics Concern   Not on file  Social History Narrative   Not on file   Social Determinants of Health   Financial Resource Strain: Not on file  Food Insecurity: Not on file  Transportation Needs: Not on file  Physical Activity: Not on file  Stress: Not on file  Social Connections: Not on file  Intimate Partner Violence: Not on file    FAMILY HISTORY: Family History  Problem Relation Age of Onset   Diabetes Mother    Hypertension Father     ALLERGIES:  is allergic to penicillins and eggs or egg-derived products.  MEDICATIONS:  Current Outpatient Medications  Medication Sig Dispense Refill   albuterol (PROVENTIL HFA;VENTOLIN HFA) 108 (90 Base) MCG/ACT inhaler Inhale 2 puffs into the lungs every 6 (six) hours as needed for wheezing or shortness of breath. 1 Inhaler 2   amLODipine (NORVASC) 5 MG tablet Take 5 mg by mouth daily.     apixaban (ELIQUIS) 5 MG TABS tablet Take 1 tablet (5 mg total) by mouth 2 (two) times daily. 60 tablet 6   aspirin EC 81 MG tablet Take 81 mg by mouth daily.     atorvastatin (LIPITOR) 40 MG tablet Take 40 mg by mouth daily.     Fluticasone-Salmeterol (ADVAIR) 250-50 MCG/DOSE AEPB INHALE 1 PUFF INTO THE LUNGS EVERY 12 HOURS     furosemide (LASIX) 20 MG tablet TAKE 1 TABLET(20 MG) BY MOUTH EVERY DAY     hydrALAZINE (APRESOLINE) 50 MG tablet Take 50 mg by mouth 3 (three) times daily.     losartan (COZAAR) 100 MG tablet Take 100 mg by  mouth daily.     metFORMIN (GLUCOPHAGE) 850 MG tablet Take 850 mg by mouth 2 (two) times daily.     metoprolol tartrate (LOPRESSOR) 100 MG tablet Take 50 mg by mouth 2 (two) times daily.     sertraline (ZOLOFT) 100 MG tablet Take 100 mg by mouth daily.     sildenafil (REVATIO) 20 MG tablet Take 100 mg by mouth as needed.     SYMBICORT 80-4.5 MCG/ACT inhaler Inhale 2 puffs into the lungs 2 (two) times daily.     tadalafil (CIALIS) 20 MG tablet Take 20 mg by mouth as needed.     traZODone (DESYREL) 100 MG tablet Take 100 mg by mouth at bedtime as needed for sleep.     No current facility-administered medications for this visit.  Review of Systems  Constitutional:  Negative for appetite change, chills, fever and unexpected weight change.  HENT:   Negative for hearing loss and voice change.   Eyes:  Negative for eye problems and icterus.  Respiratory:  Negative for chest tightness, cough and shortness of breath.   Cardiovascular:  Negative for chest pain and leg swelling.  Gastrointestinal:  Negative for abdominal distention and abdominal pain.  Endocrine: Negative for hot flashes.  Genitourinary:  Negative for difficulty urinating, dysuria and frequency.   Musculoskeletal:  Negative for arthralgias.  Skin:  Negative for itching and rash.  Neurological:  Negative for light-headedness and numbness.  Hematological:  Negative for adenopathy. Does not bruise/bleed easily.  Psychiatric/Behavioral:  Negative for confusion.    PHYSICAL EXAMINATION: ECOG PERFORMANCE STATUS: 1 - Symptomatic but completely ambulatory Vitals:   03/15/22 1318  BP: (!) 145/58  Pulse: 84  Resp: 18  Temp: (!) 97.5 F (36.4 C)  SpO2: 100%   Filed Weights   03/15/22 1318  Weight: 265 lb 1.6 oz (120.2 kg)    Physical Exam Constitutional:      General: He is not in acute distress.    Appearance: He is obese.  HENT:     Head: Normocephalic and atraumatic.  Eyes:     General: No scleral  icterus. Cardiovascular:     Rate and Rhythm: Normal rate.  Pulmonary:     Effort: Pulmonary effort is normal. No respiratory distress.     Breath sounds: No wheezing.  Abdominal:     General: Bowel sounds are normal. There is no distension.     Palpations: Abdomen is soft.  Musculoskeletal:        General: No deformity. Normal range of motion.     Cervical back: Normal range of motion and neck supple.  Skin:    General: Skin is warm and dry.     Findings: No erythema or rash.  Neurological:     Mental Status: He is alert and oriented to person, place, and time. Mental status is at baseline.     Cranial Nerves: No cranial nerve deficit.  Psychiatric:        Mood and Affect: Mood normal.     LABORATORY DATA:  I have reviewed the data as listed    Latest Ref Rng & Units 03/10/2022   10:22 AM 01/31/2018    4:28 PM  CBC  WBC 4.0 - 10.5 K/uL 9.8  14.0   Hemoglobin 13.0 - 17.0 g/dL 10.6  12.3   Hematocrit 39.0 - 52.0 % 35.8  41.1   Platelets 150 - 400 K/uL 231  272       Latest Ref Rng & Units 01/31/2018    4:28 PM  CMP  Glucose 70 - 99 mg/dL 171   BUN 8 - 23 mg/dL 17   Creatinine 0.61 - 1.24 mg/dL 1.51   Sodium 135 - 145 mmol/L 135   Potassium 3.5 - 5.1 mmol/L 3.9   Chloride 98 - 111 mmol/L 99   CO2 22 - 32 mmol/L 27   Calcium 8.9 - 10.3 mg/dL 8.9       RADIOGRAPHIC STUDIES: I have personally reviewed the radiological images as listed and agreed with the findings in the report. No results found.

## 2022-03-15 NOTE — Assessment & Plan Note (Signed)
Avoid nephrotoxins. There might be a component of anemia secondary to chronic kidney disease.

## 2022-03-15 NOTE — Assessment & Plan Note (Signed)
Hb and iron panel have improved, still iron deficienct.  Recommend IV venofer weekly x 4

## 2022-03-16 NOTE — Progress Notes (Signed)
SHOHEI, FOGLIA ([id number]) [phone] [id number] Page 1 of 5 Visit Report for 03/11/2022 Abuse Risk Screen Details Patient Name: Date of Service: [name], [name] 03/11/2022 10:00 [place] Record Number: [id number] Patient Account Number: [id number] Date of Birth/Sex: Treating RN: 12/27/1945 (77 y.o. [name], [place] [facility] Mea Ozga: Juluis Pitch Other Clinician: Referring Saanvika Vazques: Treating Elgin Carn/Extender: Vassie Moment in Treatment: 0 Abuse Risk Screen Items Answer ABUSE RISK SCREEN: Has anyone close to you tried to hurt or harm you recentlyo No Do you feel uncomfortable with anyone in your familyo No Has anyone forced you do things that you didnt want to doo No Electronic Signature(s) Signed: 03/15/2022 12:48:06 PM By: Lawanda Cousins Entered By: Lawanda Cousins on 03/11/2022 10:29:46 -------------------------------------------------------------------------------- Activities of Daily Living Details Patient Name: Date of Service: [name], [name] 03/11/2022 10:00 [place] Record Number: [id number] Patient Account Number: [id number] Date of Birth/Sex: Treating RN: Dec 05, 1945 (77 y.o. [name], [place] [facility] Tsering Leaman: Juluis Pitch Other Clinician: Referring Shirel Mallis: Treating Wajiha Versteeg/Extender: Vassie Moment in Treatment: 0 Activities of Daily Living Items Answer Activities of Daily Living (Please select one for each item) Drive Automobile Completely Able T Medications ake Completely Able Use T elephone Completely Able Care for Appearance Completely Able Use T oilet Completely Able Bath / Shower Completely Able Dress Self Completely Able Feed Self Completely Able Walk Completely Able Get In / Out Bed Completely Able Housework Completely [place], [place] ([id number]) [id number] Page 2 of 5 Prepare Meals Completely Able Handle Money  Completely Able Shop for Self Completely Able Electronic Signature(s) Signed: 03/15/2022 12:48:06 PM By: Lawanda Cousins Entered By: Lawanda Cousins on 03/11/2022 10:30:22 -------------------------------------------------------------------------------- Education Screening Details Patient Name: Date of Service: [name], [name]. 03/11/2022 10:00 [place] Oneal Record Number: [id number] Patient Account Number: [id number] Date of Birth/Sex: Treating RN: 1945-09-15 (77 y.o. [name], [place] [facility] Kamerin Axford: Juluis Pitch Other Clinician: Referring Waylen Depaolo: Treating Kanylah Muench/Extender: Vassie Moment in Treatment: 0 Primary Learner Assessed: Patient Learning Preferences/Education Level/Primary Language Learning Preference: Explanation Highest Education Level: High School Preferred Language: English Cognitive Barrier Language Barrier: No Translator Needed: No Memory Deficit: No Emotional Barrier: No Cultural/Religious Beliefs Affecting Medical Care: No Physical Barrier Impaired Vision: No Impaired Hearing: No Decreased Hand dexterity: No Knowledge/Comprehension Knowledge Level: High Comprehension Level: High Ability to understand written instructions: Medium Ability to understand verbal instructions: Medium Motivation Anxiety Level: Calm Cooperation: Cooperative Education Importance: Acknowledges Need Interest in Health Problems: Asks Questions Perception: Coherent Willingness to Engage in Self-Management High Activities: Readiness to Engage in Self-Management High Activities: Electronic Signature(s) Signed: 03/15/2022 12:48:06 PM By: Lawanda Cousins Entered By: Lawanda Cousins on 03/11/2022 10:31:40 Cherlynn June ([id number]) [id number] Page 3 of 5 -------------------------------------------------------------------------------- Fall Risk Assessment Details Patient Name: Date of Service: [place], [name] 03/11/2022  10:00 [name] Record Number: [id number] Patient Account Number: [id number] Date of Birth/Sex: Treating RN: 05-Jan-1946 (77 y.o. [name], [place] [facility] Renard Caperton: Juluis Pitch Other Clinician: Referring [place] Leinen: Treating Charnice Zwilling/Extender: Vassie Moment in Treatment: 0 Fall Risk Assessment Items Have you had 2 or more falls in the last 12 monthso 0 No Have you had any fall that resulted in injury in the last 12 monthso 0 No FALLS RISK SCREEN History of falling - immediate or within 3 months 0 No Secondary diagnosis (Do you have 2 or more medical diagnoseso) 0 No Ambulatory aid None/bed rest/wheelchair/nurse  0 Yes Crutches/cane/walker 0 No Furniture 0 No Intravenous therapy Access/Saline/Heparin Lock 0 No Gait/Transferring Normal/ bed rest/ wheelchair 0 Yes Weak (short steps with or without shuffle, stooped but able to lift head while walking, may seek 0 No support from furniture) Impaired (short steps with shuffle, may have difficulty arising from chair, head down, impaired 0 No balance) Mental Status Oriented to own ability 0 Yes Electronic Signature(s) Signed: 03/15/2022 12:48:06 PM By: Lawanda Cousins Entered By: Lawanda Cousins on 03/11/2022 10:31:53 -------------------------------------------------------------------------------- Foot Assessment Details Patient Name: Date of Service: [name], [name]. 03/11/2022 10:00 [place] Record Number: [id number] Patient Account Number: [id number] Date of Birth/Sex: Treating RN: Dec 01, 1945 (77 y.o. [name], [name] Primary Care Adeli Frost: Juluis Pitch Other Clinician: Referring Alanea Woolridge: Treating Samya Siciliano/Extender: Vassie Moment in Treatment: 0 Foot Assessment Items Site Locations KESHAWN, DUNAWAY ([id number]) [phone] [id number] Page 4 of 5 + = Sensation present, - = Sensation absent, C = Callus, U = Ulcer R = Redness, W = Warmth, M = Maceration,  PU = Pre-ulcerative lesion F = Fissure, S = Swelling, D = Dryness Assessment Right: Left: Other Deformity: No No Prior Foot Ulcer: No No Prior Amputation: No No Charcot Joint: No No Ambulatory Status: Ambulatory Without Help Gait: Steady Electronic Signature(s) Signed: 03/15/2022 12:48:06 PM By: Lawanda Cousins Entered By: Lawanda Cousins on 03/11/2022 10:35:00 -------------------------------------------------------------------------------- Nutrition Risk Screening Details Patient Name: Date of Service: [place], [place]. 03/11/2022 10:00 [place] Record Number: [id number] Patient Account Number: [id number] Date of Birth/Sex: Treating RN: [birth year] (77 y.o. [name], [place] [facility] Symphony Demuro: Juluis Pitch Other Clinician: Referring Chardonay [place]: Treating Arriana Lohmann/Extender: Vassie Moment in Treatment: 0 Height (in): 66 Weight (lbs): 265 Body Mass Index (BMI): 42.8 Nutrition Risk Screening Items Score Screening NUTRITION RISK SCREEN: I have an illness or condition that made me change the kind and/or amount of food I eat 2 Yes I eat fewer than two meals per day 0 No I eat few fruits and vegetables, or milk products 0 No I have three or more drinks of beer, liquor or wine almost every day 0 No I have tooth or mouth problems that make it hard for me to eat 0 No I don't always have enough money to buy the food I need 0 No Coletti, Teion L ([id number]) [id number] Page 5 of 5 I eat alone most of the time 0 No I take three or more different prescribed or over-the-counter drugs a day 1 Yes Without wanting to, I have lost or gained 10 pounds in the last six months 0 No I am not always physically able to shop, cook and/or feed myself 0 No Nutrition Protocols Good Risk Protocol Moderate Risk Protocol 0 Provide education on nutrition High Risk Proctocol Risk Level: Moderate Risk Score: 3 Electronic Signature(s) Signed:  03/15/2022 12:48:06 PM By: Lawanda Cousins Entered By: Lawanda Cousins on 03/11/2022 10:32:17

## 2022-03-16 NOTE — Progress Notes (Signed)
Justin, POPPEN ([id number]) [id number] Page 1 of 9 Visit Report for 03/11/2022 Chief Complaint Document Details Patient Name: Date of Service: [name], [name] 03/11/2022 10:00 [place] Record Number: [id number] Patient Account Number: [id number] Date of Birth/Sex: Treating RN: 03-12-1945 (77 y.o. [name] Primary Care Provider: Juluis Oneal Other Clinician: Referring Provider: Treating Provider/Extender: [name] in Treatment: 0 Information Obtained from: Patient Chief Complaint Bilateral LE swelling with poor arterial flow on screening Electronic Signature(s) Signed: 03/11/2022 10:58:40 AM By: Justin Keeler PA-C Entered By: [name] on 03/11/2022 10:58:40 -------------------------------------------------------------------------------- HPI Details Patient Name: Date of Service: [name], Justin [place] L. 03/11/2022 10:00 [place] Record Number: [id number] Patient Account Number: [id number] Date of Birth/Sex: Treating RN: 1946/01/10 (77 y.o. [name] Primary Care Provider: Juluis Oneal Other Clinician: Referring Provider: Treating Provider/Extender: [name] in Treatment: 0 History of Present Illness HPI Description: 03-11-2022 upon evaluation today patient presents for initial evaluation here in our clinic concerning issues that the patient has been having with a chronic lower extremity swelling and skin change along with intermittent weeping and drainage. He does upon initial inspection seem to have bilateral lower extremity lymphedema which is actually quite significant. It is also for the most part pretty much untreated. With that being said he was referred to [place] due to having wounds although he actually has no open wound at this point. He has not seen vascular and has not been evaluated by them up to this point. Fortunately I do not see any signs of active infection at this  time. No fevers, chills, nausea, vomiting, or diarrhea. When performing ABIs here in the clinic today he did not have any discernible flow that could be identified. He was essentially noncompressible. With that being said I think that he probably needs to see vascular to see if there is anything going on in that regard from an arterial standpoint if the answer is no that his arterial flow is okay probably needs a venous workup as well. This could all lead to him getting lymphedema pumps which I think would be beneficial for him. Patient does have a history of lymphedema, chronic venous hypertension, peripheral vascular disease, diabetes mellitus type 2, hypertension, COPD, chronic kidney disease stage III, atrial fibrillation, and long-term use of anticoagulant therapy. His most recent hemoglobin A1c was 8.1 in February 2024. Electronic Signature(s) [name] ([id number]) [id number] Page 2 of 9 Signed: 03/11/2022 12:56:35 PM By: Justin Keeler PA-C Entered By: [name] on 03/11/2022 12:56:35 -------------------------------------------------------------------------------- Physical Exam Details Patient Name: Date of Service: [name]. 03/11/2022 10:00 [place] Record Number: [id number] Patient Account Number: [id number] Date of Birth/Sex: Treating RN: [birth year] (77 y.o. [name] Primary Care Provider: Juluis Oneal Other Clinician: Referring Provider: Treating Provider/Extender: [name] in Treatment: 0 Constitutional sitting or standing blood pressure is within target range for patient.. pulse regular and within target range for patient.Marland Kitchen respirations regular, non-labored and within target range for patient.Marland Kitchen temperature within target range for patient.. Well-nourished and well-hydrated in no acute distress. Eyes conjunctiva clear no eyelid edema noted. pupils equal round and reactive to light and  accommodation. Ears, Nose, Mouth, and Throat no gross abnormality of ear auricles or external auditory canals. normal hearing noted during conversation. mucus membranes moist. Respiratory normal breathing without difficulty. Cardiovascular Absent posterior tibial and dorsalis pedis pulses bilateral lower extremities. Patient has chronic  stage III lymphedema noted.. Musculoskeletal normal gait and posture. no significant deformity or arthritic changes, no loss or range of motion, no clubbing. Psychiatric this patient is able to make decisions and demonstrates good insight into disease process. Alert and Oriented x 3. pleasant and cooperative. Notes Upon inspection patient's wounds actually are completely resolved there does not appear to be anything open at this point which is great news and overall I am extremely pleased with where things stand. I do not see any signs of infection locally or systemically which is great news as well. Electronic Signature(s) Signed: 03/11/2022 12:57:15 PM By: Justin Keeler PA-C Entered By: [name] on 03/11/2022 12:57:15 -------------------------------------------------------------------------------- Physician Orders Details Patient Name: Date of Service: [name], [name]. 03/11/2022 10:00 [place] Record Number: [id number] Patient Account Number: [id number] Date of Birth/Sex: Treating RN: 06-03-1945 (77 y.o. [name], [place] Primary Care Provider: Juluis Oneal Other Clinician: DURK, Oneal ([id number]) [id number] Page 3 of 9 Referring Provider: Treating Provider/Extender: [name] in Treatment: 0 Verbal / Phone Orders: No Diagnosis Coding ICD-10 Coding Code Description [phone] Chronic venous hypertension (idiopathic) with ulcer and inflammation of bilateral lower extremity I73.89 Other specified peripheral vascular diseases E11.628 Type 2 diabetes mellitus with other skin  complications [id number] Essential (primary) hypertension J44.9 Chronic obstructive pulmonary disease, unspecified N18.30 Chronic kidney disease, stage 3 unspecified I48.0 Paroxysmal atrial fibrillation Z79.01 Long term (current) use of anticoagulants Discharge From [facility] Consult Only Edema Control - Lymphedema / Segmental Compressive Device / Other Bilateral Lower Extremities Elevate, Exercise Daily and A void Standing for Long Periods of Time. Elevate legs to the level of the heart and pump ankles as often as possible Elevate leg(s) parallel to the floor when sitting. Consults Vascular - pvd, lymphedema, pad (right) - (ICD10 I73.89 - Other specified peripheral vascular diseases) Electronic Signature(s) Signed: 03/11/2022 1:46:09 PM By: Justin Keeler PA-C Signed: 03/15/2022 12:48:06 PM By: Lawanda Cousins Entered By: Lawanda Cousins on 03/11/2022 11:10:32 Prescription 03/11/2022 -------------------------------------------------------------------------------- Norenberg, Cassey L. Jeri Cos PA-C Patient Name: Provider: 09/07/1945 [id number] Date of Birth: NPI#: Jerilynn Mages [id number] Sex: DEA #: [phone] Phone #: License #: [place] Patient Address: [id number] [facility] [place], [place] [address] [address], [place], [place] [address] [phone] Allergies penicillin; Egg Derived Provider's Orders Vascular - ICD10: I73.89 - pvd, lymphedema, pad (right) Hand Signature: Date(s): CHRISTOPHERJOSE, JANOSIK ([id number]) [id number] Page 4 of 9 Electronic Signature(s) Signed: 03/11/2022 1:46:09 PM By: Justin Keeler PA-C Signed: 03/15/2022 12:48:06 PM By: Lawanda Cousins Entered By: Lawanda Cousins on 03/11/2022 11:10:33 -------------------------------------------------------------------------------- Problem List Details Patient Name: Date of Service: [name], Justin [place]. 03/11/2022 10:00 [place] Record Number: [id number] Patient Account Number: [id number] Date of Birth/Sex: Treating RN: 12/04/1945 (77 y.o. [name] Primary Care Provider: Juluis Oneal Other Clinician: Referring Provider: Treating Provider/Extender: [name] in Treatment: 0 Active Problems ICD-10 Encounter Code Description Active Date MDM Diagnosis I89.0 Lymphedema, not elsewhere classified 03/11/2022 No Yes I87.333 Chronic venous hypertension (idiopathic) with ulcer and inflammation of 03/11/2022 No Yes bilateral lower extremity I73.89 Other specified peripheral vascular diseases 03/11/2022 No Yes E11.628 Type 2 diabetes mellitus with other skin complications [id number] No Yes I10 Essential (primary) hypertension 03/11/2022 No Yes J44.9 Chronic obstructive pulmonary disease, unspecified 03/11/2022 No Yes N18.30 Chronic kidney disease, stage 3 unspecified 03/11/2022 No Yes I48.0 Paroxysmal atrial fibrillation 03/11/2022 No Yes Z79.01 Long  term (current) use of anticoagulants 03/11/2022 No Yes Inactive Problems Resolved Problems OVE, AMAT ([id number]) [id number] Page 5 of 9 Electronic Signature(s) Signed: 03/11/2022 12:11:00 PM By: Justin Keeler PA-C Previous Signature: 03/11/2022 10:58:00 AM Version By: Justin Keeler PA-C Previous Signature: 03/11/2022 10:57:33 AM Version By: Justin Keeler PA-C Entered By: [name] on 03/11/2022 12:11:00 -------------------------------------------------------------------------------- Progress Note Details Patient Name: Date of Service: [name], Justin BERT L. 03/11/2022 10:00 [place] Record Number: [id number] Patient Account Number: [id number] Date of Birth/Sex: Treating RN: 07/23/1945 (77 y.o. [name] Primary Care Provider: Juluis Oneal Other Clinician: Referring Provider: Treating Provider/Extender: [name] in Treatment: 0 Subjective Chief  Complaint Information obtained from Patient Bilateral LE swelling with poor arterial flow on screening History of Present Illness (HPI) 03-11-2022 upon evaluation today patient presents for initial evaluation here in our clinic concerning issues that the patient has been having with a chronic lower extremity swelling and skin change along with intermittent weeping and drainage. He does upon initial inspection seem to have bilateral lower extremity lymphedema which is actually quite significant. It is also for the most part pretty much untreated. With that being said he was referred to [place] due to having wounds although he actually has no open wound at this point. He has not seen vascular and has not been evaluated by them up to this point. Fortunately I do not see any signs of active infection at this time. No fevers, chills, nausea, vomiting, or diarrhea. When performing ABIs here in the clinic today he did not have any discernible flow that could be identified. He was essentially noncompressible. With that being said I think that he probably needs to see vascular to see if there is anything going on in that regard from an arterial standpoint if the answer is no that his arterial flow is okay probably needs a venous workup as well. This could all lead to him getting lymphedema pumps which I think would be beneficial for him. Patient does have a history of lymphedema, chronic venous hypertension, peripheral vascular disease, diabetes mellitus type 2, hypertension, COPD, chronic kidney disease stage III, atrial fibrillation, and long-term use of anticoagulant therapy. His most recent hemoglobin A1c was 8.1 in February 2024. Patient History Information obtained from Patient, Chart. Allergies penicillin (Severity: Moderate, Reaction: swelling), Egg Derived Social History Former smoker, Alcohol Use - Never. Medical History Hematologic/Lymphatic Patient has history of Anemia - iron deficiency,  Lymphedema Respiratory Patient has history of Chronic Obstructive Pulmonary Disease (COPD), Sleep Apnea - CPAP AT NIGHT Cardiovascular Patient has history of Arrhythmia - a fib, Hypertension, Peripheral Venous Disease Endocrine Patient has history of Type II Diabetes - A1c 8.1 (02/2022) Oncologic Patient has history of Received Radiation - radiation seeds 2004 Patient is treated with Oral Agents. Hospitalization/Surgery History - colonoscopy/egd. - cardioversion. - TEE. 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 pulse regular and within target range for patient.Marland Kitchen respirations regular, non-labored and within target range for patient.Marland Kitchen temperature within target range for patient.. Well-nourished and well-hydrated in no acute distress. Vitals Time Taken: 10:13 AM, Height: 66 in, Source: Stated, Weight: 265 lbs, Source: Stated, BMI: 42.8, Temperature: 97.9 F, Pulse: 70 bpm, Respiratory Rate: 18 breaths/min, Blood Pressure: 134/65 mmHg. Eyes conjunctiva clear no eyelid  edema noted. pupils equal round and reactive to light and accommodation. Ears, Nose, Mouth, and Throat no gross abnormality of ear auricles or external auditory canals. normal hearing noted during conversation. mucus membranes moist. Respiratory normal breathing without difficulty. Cardiovascular Absent posterior tibial and dorsalis pedis pulses bilateral lower extremities. Patient has chronic stage III lymphedema noted.. Musculoskeletal normal gait and posture. no significant deformity or arthritic changes, no loss or range of motion, no clubbing. Psychiatric this patient is able to make decisions and demonstrates good  insight into disease process. Alert and Oriented x 3. pleasant and cooperative. General Notes: Upon inspection patient's wounds actually are completely resolved there does not appear to be anything open at this point which is great news and overall I am extremely pleased with where things stand. I do not see any signs of infection locally or systemically which is great news as well. Other Condition(s) Patient presents with Lymphedema located on the Bilateral Leg. Assessment Active Problems ICD-10 Lymphedema, not elsewhere classified Chronic venous hypertension (idiopathic) with ulcer and inflammation of bilateral lower extremity Other specified peripheral vascular diseases Type 2 diabetes mellitus with other skin complications Essential (primary) hypertension Chronic obstructive pulmonary disease, unspecified Chronic kidney disease, stage 3 unspecified Paroxysmal atrial fibrillation Long term (current) use of anticoagulants Plan Discharge From [facility] Services: Consult Only Edema Control - Lymphedema / Segmental Compressive Device / Other: Elevate, Exercise Daily and Avoid Standing for Long Periods of Time. Elevate legs to the level of the heart and pump ankles as often as possible Elevate leg(s) parallel to the floor when sitting. Consults ordered were: Vascular - pvd, lymphedema, pad (right) 1. Based on what I am seeing I do believe that the patient should go ahead and continue to monitor for any signs of infection or worsening. Based on what I am seeing I think that he is completely closed there is no open wounds and I think is doing quite well in that regard. [name], [name] ([id number]) [id number] Page 7 of 9 2. I do believe the patient would benefit from a referral to vascular for further evaluation of his arterial flow as well as venous flow. I think eventually he would be likely a good candidate for lymphedema pumps they can proceed down that road as  well as deemed necessary. 3. I would also recommend for the time being he needs to be elevate his legs as well is much as possible and try to use compression socks is much as he can. He does also need to consider that anytime he is resting and relaxing elevate his legs would definitely benefit him. We will see the patient back for follow-up visit as needed. Electronic Signature(s) Signed: 03/11/2022 12:58:34 PM By: Justin Keeler PA-C Entered By: [name] on 03/11/2022 12:58:33 -------------------------------------------------------------------------------- ROS/PFSH Details Patient Name: Date of Service: [place], [place]. 03/11/2022 10:00 [place] Record Number: [id number] Patient Account Number: [id number] Date of Birth/Sex: Treating RN: Oct 18, 1945 (77 y.o. [name], [place] Primary Care Provider: Juluis Oneal Other Clinician: Referring Provider: Treating Provider/Extender: [name] in Treatment: 0 Information Obtained From Patient Chart Hematologic/Lymphatic Medical History: Positive for: Anemia - iron deficiency; Lymphedema Respiratory Medical History: Positive for: Chronic Obstructive Pulmonary Disease (COPD); Sleep Apnea - CPAP AT NIGHT Cardiovascular Medical History: Positive for: Arrhythmia - a fib; Hypertension; Peripheral Venous Disease Past Medical History Notes: hyperlipidemia Gastrointestinal Medical History: Past Medical History Notes: ckd 4 Endocrine Medical History: Positive for: Type II Diabetes - A1c 8.1 (  02/2022) Past Medical History Notes: left thyroid nodule Time with diabetes: 30 years Treated with: Oral agents Oncologic Medical History: Positive for: Received Radiation - radiation seeds 2004 Past Medical History Notes: prostate cancer Psychiatric DIAMOND, OLIVARES ([id number]) [id number] Page 8 of 9 Medical History: Past Medical History Notes: anxiety Immunizations Pneumococcal  Vaccine: Received Pneumococcal Vaccination: Yes Received Pneumococcal Vaccination On or After 60th Birthday: Yes Implantable Devices No devices added Hospitalization / Surgery History Type of Hospitalization/Surgery colonoscopy/egd cardioversion TEE Family and Social History Former smoker; Alcohol Use: Never Electronic Signature(s) Signed: 03/11/2022 1:46:09 PM By: Justin Keeler PA-C Signed: 03/15/2022 12:48:06 PM By: Lawanda Cousins Entered By: Lawanda Cousins on 03/11/2022 10:50:56 -------------------------------------------------------------------------------- SuperBill Details Patient Name: Date of Service: [name], [name]. 03/11/2022 Medical Record Number: [id number] Patient Account Number: [id number] Date of Birth/Sex: Treating RN: 1945-11-05 (77 y.o. Isac Sarna, Maudie [place] Primary Care Provider: Juluis Oneal Other Clinician: Referring Provider: Treating Provider/Extender: [name] in Treatment: 0 Diagnosis Coding ICD-10 Codes Code Description I89.0 Lymphedema, not elsewhere classified I87.333 Chronic venous hypertension (idiopathic) with ulcer and inflammation of bilateral lower extremity I73.89 Other specified peripheral vascular diseases E11.628 Type 2 diabetes mellitus with other skin complications [id number] Essential (primary) hypertension J44.9 Chronic obstructive pulmonary disease, unspecified N18.30 Chronic kidney disease, stage 3 unspecified I48.0 Paroxysmal atrial fibrillation Z79.01 Long term (current) use of anticoagulants Facility Procedures : CPT4 Code: [id number] Description: 99213 - WOUND CARE VISIT-LEV 3 EST PT Modifier: Quantity: 1 Physician Procedures : CPT4 Code Description Modifier DESHUN, URLACHER ([id number]) [id number] WC PHYS LEVEL 3 Justin PT ICD-10 Diagnosis Description I89.0 Lymphedema, not elsewhere classified I87.333 Chronic venous hypertension (idiopathic) with ulcer  and inflammation of bilateral  lower extremi I73.89 Other specified peripheral vascular diseases E11.628 Type 2 diabetes mellitus with other skin complications Quantity: [id number] Page 9 of 9 1 ty Electronic Signature(s) Signed: 03/11/2022 12:58:54 PM By: Justin Keeler PA-C Entered By: [name] on 03/11/2022 12:58:53

## 2022-03-16 NOTE — Progress Notes (Addendum)
SHAD, ANDAYA ([id number]) [id number] Page 1 of 6 Visit Report for 03/11/2022 Allergy List Details Patient Name: Date of Service: [name] 03/11/2022 10:00 A M Medical Record Number: [id number] Patient Account Number: [id number] Date of Birth/Sex: Treating RN: Jun 19, 1945 (77 y.o. [name] Primary Care Dimarco Minkin: Dorothey Baseman Other Clinician: Referring Genevive Printup: Treating Vikkie Goeden/Extender: Jannifer Rodney in Treatment: 0 Allergies Active Allergies penicillin Reaction: swelling Severity: Moderate Egg Derived Allergy Notes Electronic Signature(s) Signed: 03/15/2022 12:48:06 PM By: Bonnell Public Entered By: Bonnell Public on 03/10/2022 10:38:56 -------------------------------------------------------------------------------- Arrival Information Details Patient Name: Date of Service: [name], [name]. 03/11/2022 10:00 A M Medical Record Number: [id number] Patient Account Number: [id number] Date of Birth/Sex: Treating RN: 1945/07/16 (77 y.o. [name] Primary Care Kamiah Fite: Dorothey Baseman Other Clinician: Referring Stephanye Finnicum: Treating Elody Kleinsasser/Extender: Jannifer Rodney in Treatment: 0 Visit Information Patient Arrived: Ambulatory Arrival Time: 10:11 Accompanied By: spouse Transfer Assistance: None Patient Identification Verified: Yes Secondary Verification Process Completed: Yes Electronic Signature(s) Signed: 03/15/2022 12:48:06 PM By: Bonnell Public Entered By: Bonnell Public on 03/11/2022 10:12:49 -------------------------------------------------------------------------------- Clinic Level of Care Assessment Details Patient Name: Date of Service: [facility] Oneal, [name] 03/11/2022 10:00 A M Medical Record Number: [id number] Patient Account Number: [id number] Date of Birth/Sex: Treating RN: 1945/09/30 (77 y.o. [name] Primary Care Yaritza Leist: Dorothey Baseman Other  Clinician: Referring Amr Sturtevant: Treating Blayke Cordrey/Extender: Jannifer Rodney in Treatment: 0 Clinic Level of Care Assessment Items TOOL 2 Quantity Score X- 1 0 Use when only an EandM is performed on the INITIAL visit ASSESSMENTS - Nursing Assessment / Reassessment X- 1 20 General Physical Exam (combine w/ comprehensive assessment (listed just below) when performed on new pt. evals) [name] ([id number]) [id number] Page 2 of 6 X- 1 25 Comprehensive Assessment (HX, ROS, Risk Assessments, Wounds Hx, etc.) ASSESSMENTS - Wound and Skin A ssessment / Reassessment []  - 0 Simple Wound Assessment / Reassessment - one wound []  - 0 Complex Wound Assessment / Reassessment - multiple wounds X- 1 10 Dermatologic / Skin Assessment (not related to wound area) ASSESSMENTS - Ostomy and/or Continence Assessment and Care []  - 0 Incontinence Assessment and Management []  - 0 Ostomy Care Assessment and Management (repouching, etc.) PROCESS - Coordination of Care []  - 0 Simple Patient / Family Education for ongoing care []  - 0 Complex (extensive) Patient / Family Education for ongoing care X- 1 10 Staff obtains Chiropractor, Records, T Results / Process Orders est []  - 0 Staff telephones HHA, Nursing Homes / Clarify orders / etc []  - 0 Routine Transfer to another Facility (non-emergent condition) []  - 0 Routine Hospital Admission (non-emergent condition) X- 1 15 New Admissions / Manufacturing engineer / Ordering NPWT Apligraf, etc. , []  - 0 Emergency Hospital Admission (emergent condition) X- 1 10 Simple Discharge Coordination []  - 0 Complex (extensive) Discharge Coordination PROCESS - Special Needs []  - 0 Pediatric / Minor Patient Management []  - 0 Isolation Patient Management []  - 0 Hearing / Language / Visual special needs []  - 0 Assessment of Community assistance (transportation, D/C planning, etc.) []  - 0 Additional assistance  / Altered mentation []  - 0 Support Surface(s) Assessment (bed, cushion, seat, etc.) INTERVENTIONS - Wound Cleansing / Measurement []  - 0 Wound Imaging (photographs - any number of wounds) []  - 0 Wound Tracing (instead of photographs) []  - 0 Simple Wound Measurement - one wound []  - 0 Complex Wound Measurement - multiple wounds []  - 0  Simple Wound Cleansing - one wound  - 0 Complex Wound Cleansing - multiple wounds INTERVENTIONS - Wound Dressings  - 0 Small Wound Dressing one or multiple wounds  - 0 Medium Wound Dressing one or multiple wounds  - 0 Large Wound Dressing one or multiple wounds  - 0 Application of Medications - injection INTERVENTIONS - Miscellaneous  - 0 External ear exam  - 0 Specimen Collection (cultures, biopsies, blood, body fluids, etc.)  - 0 Specimen(s) / Culture(s) sent or taken to Lab for analysis  - 0 Patient Transfer (multiple staff / Michiel Sites Lift / Similar devices)  - 0 Simple Staple / Suture removal (25 or less) Blea, [name] ([id number]) [id number] Page 3 of 6  - 0 Complex Staple / Suture removal (26 or more)  - 0 Hypo / Hyperglycemic Management (close monitor of Blood Glucose) X- 1 15 Ankle / Brachial Index (ABI) - do not check if billed separately Has the patient been seen at the hospital within the last three years: Yes Total Score: 105 Level Of Care: New/Established - Level 3 Electronic Signature(s) Signed: 03/15/2022 12:48:06 PM By: Bonnell Public Entered By: Bonnell Public on 03/11/2022 11:15:41 -------------------------------------------------------------------------------- Encounter Discharge Information Details Patient Name: Date of Service: [name], [name]. 03/11/2022 10:00 A M Medical Record Number: [id number] Patient Account Number: [id number] Date of Birth/Sex: Treating RN: 10-14-1945 (77 y.o. [name] Primary Care Cyenna Rebello: Dorothey Baseman Other  Clinician: Referring Misty Rago: Treating Briyah Wheelwright/Extender: Jannifer Rodney in Treatment: 0 Encounter Discharge Information Items Discharge Condition: Stable Ambulatory Status: Ambulatory Discharge Destination: Home Accompanied By: spouse Schedule Follow-up Appointment: No Clinical Summary of Care: Electronic Signature(s) Signed: 03/15/2022 12:48:06 PM By: Bonnell Public Entered By: Bonnell Public on 03/11/2022 11:16:44 -------------------------------------------------------------------------------- Lower Extremity Assessment Details Patient Name: Date of Service: [facility] Oneal, [name]. 03/11/2022 10:00 A M Medical Record Number: [id number] Patient Account Number: [id number] Date of Birth/Sex: Treating RN: 02/18/1945 (77 y.o. [name] Primary Care Shanta Hartner: Dorothey Baseman Other Clinician: Referring Ajeet Casasola: Treating Raeya Merritts/Extender: Jannifer Rodney in Treatment: 0 Edema Assessment Assessed: [Left: No] [Right: No] Edema: [Left: Yes] [Right: Yes] Calf Left: Right: Point of Measurement: 35 cm From Medial Instep 52 cm 52.7 cm Ankle Left: Right: Point of Measurement: 11 cm From Medial Instep 33 cm 35 cm Knee To Floor Left: Right: From Medial Instep 43 cm 43 cm Vascular Assessment Pulses: Dorsalis Pedis Palpable: [Left:Yes] [Right:No] Doppler Audible: [Right:Yes] Hoppel, [name] ([id number]) [Right:[id number] Page 4 of 6] Posterior Tibial Palpable: [Left:Yes] [Right:No] Doppler Audible: [Right:Yes] Popliteal Palpable: [Right:Yes] Blood Pressure: Brachial: [Left:142] Ankle: [Left:Dorsalis Pedis: 146 1.03] Notes abi machine used; right detected PAD without numeric results Electronic Signature(s) Signed: 03/15/2022 12:48:06 PM By: Bonnell Public Entered By: Bonnell Public on 03/11/2022 10:46:19 -------------------------------------------------------------------------------- Multi-Disciplinary Care Plan  Details Patient Name: Date of Service: [facility] Oneal, [name]. 03/11/2022 10:00 A M Medical Record Number: [id number] Patient Account Number: [id number] Date of Birth/Sex: Treating RN: [birth year] (77 y.o. [name] Primary Care Monta Maiorana: Dorothey Baseman Other Clinician: Referring Earle Burson: Treating [name]/Extender: Jannifer Rodney in Treatment: 0 Active Inactive Electronic Signature(s) Signed: 03/30/2022 8:22:00 AM By: Elliot Gurney, BSN, RN, CWS, Kim RN, BSN Signed: 04/27/2022 7:53:24 AM By: Bonnell Public Previous Signature: 03/15/2022 12:48:06 PM Version By: Bonnell Public Entered By: Elliot Gurney BSN, RN, CWS, [name] on 03/30/2022 08:22:00 -------------------------------------------------------------------------------- Non-Wound Condition Assessment Details Patient Name: Date of Service: [name], [name]. 03/11/2022 10:00 A M Medical Record Number: [id number] Patient  Account Number: [id number] Date of Birth/Sex: Treating RN: 1945-05-23 (77 y.o. [name] Primary Care Lira Stephen: Dorothey Baseman Other Clinician: Referring Merion Grimaldo: Treating [name]/Extender: Jannifer Rodney in Treatment: 0 Non-Wound Condition: Condition: Lymphedema Location: Leg Side: Bilateral Notes: no weeping, no blistering, no identifiable ulcerations/lesions Photos Electronic Signature(s) Signed: 03/15/2022 12:48:06 PM By: [name] ([id number]) By: Bonnell Public [phone].pdf Page 5 of 6 Signed: 03/15/2022 12:48:06 PM Entered By: Bonnell Public on 03/11/2022 12:08:48 -------------------------------------------------------------------------------- Pain Assessment Details Patient Name: Date of Service: [facility] Oneal, [name] 03/11/2022 10:00 A M Medical Record Number: [id number] Patient Account Number: [id number] Date of Birth/Sex: Treating RN: 16-Jul-1945 (77 y.o. [name] Primary Care Adonte Vanriper: Dorothey Baseman Other  Clinician: Referring Mckinsley Koelzer: Treating [name]/Extender: Jannifer Rodney in Treatment: 0 Active Problems Location of Pain Severity and Description of Pain Patient Has Paino No Site Locations Pain Management and Medication Current Pain Management: Electronic Signature(s) Signed: 03/15/2022 12:48:06 PM By: Bonnell Public Entered By: Bonnell Public on 03/11/2022 10:15:55 -------------------------------------------------------------------------------- Patient/Caregiver Education Details Patient Name: Date of Service: [name], [name]. 3/1/2024andnbsp10:00 A M Medical Record Number: [id number] Patient Account Number: [id number] Date of Birth/Gender: Treating RN: 1945-06-19 (77 y.o. [name] Primary Care Physician: Dorothey Baseman Other Clinician: Referring Physician: Treating Physician/Extender: Jannifer Rodney in Treatment: 0 Education Assessment Education Provided To: Patient Education Topics Provided Peripheral Neuropathy: Handouts: Neuropathy Methods: Explain/Verbal Responses: State content correctly Electronic Signature(s) Signed: 03/15/2022 12:48:06 PM By: [name] ([id number]) [id number] Page 6 of 6 Entered By: Bonnell Public on 03/11/2022 10:52:37 -------------------------------------------------------------------------------- Vitals Details Patient Name: Date of Service: [facility] Oneal, [name]. 03/11/2022 10:00 A M Medical Record Number: [id number] Patient Account Number: [id number] Date of Birth/Sex: Treating RN: [birth year] (77 y.o. [name] Primary Care Mckinna Demars: Dorothey Baseman Other Clinician: Referring Maedell Hedger: Treating [name]/Extender: Jannifer Rodney in Treatment: 0 Vital Signs Time Taken: 10:13 Temperature (F): 97.9 Height (in): 66 Pulse (bpm): 70 Source: Stated Respiratory Rate (breaths/min): 18 Weight (lbs): 265 Blood Pressure  (mmHg): 134/65 Source: Stated Reference Range: 80 - 120 mg / dl Body Mass Index (BMI): 42.8 Electronic Signature(s) Signed: 03/15/2022 12:48:06 PM By: Bonnell Public Entered By: Bonnell Public on 03/11/2022 10:21:44

## 2022-03-22 ENCOUNTER — Inpatient Hospital Stay: Payer: Medicare Other

## 2022-03-22 VITALS — BP 145/60 | HR 65 | Temp 96.8°F | Resp 18

## 2022-03-22 DIAGNOSIS — D509 Iron deficiency anemia, unspecified: Secondary | ICD-10-CM | POA: Diagnosis not present

## 2022-03-22 DIAGNOSIS — D500 Iron deficiency anemia secondary to blood loss (chronic): Secondary | ICD-10-CM

## 2022-03-22 MED ORDER — SODIUM CHLORIDE 0.9 % IV SOLN
Freq: Once | INTRAVENOUS | Status: AC
  Filled 2022-03-22: qty 250

## 2022-03-22 MED ORDER — SODIUM CHLORIDE 0.9 % IV SOLN
200.0000 mg | Freq: Once | INTRAVENOUS | Status: AC
  Administered 2022-03-22: 200 mg via INTRAVENOUS
  Filled 2022-03-22: qty 200

## 2022-03-28 ENCOUNTER — Ambulatory Visit
Admission: RE | Admit: 2022-03-28 | Discharge: 2022-03-28 | Disposition: A | Discharge status: Home / Self Care | Payer: Medicare Other | Source: Ambulatory Visit | Attending: Urology | Admitting: Urology

## 2022-03-28 DIAGNOSIS — R918 Other nonspecific abnormal finding of lung field: Secondary | ICD-10-CM | POA: Insufficient documentation

## 2022-03-28 DIAGNOSIS — C61 Malignant neoplasm of prostate: Secondary | ICD-10-CM | POA: Diagnosis not present

## 2022-03-28 DIAGNOSIS — R972 Elevated prostate specific antigen [PSA]: Secondary | ICD-10-CM | POA: Diagnosis not present

## 2022-03-28 MED ORDER — PIFLIFOLASTAT F 18 (PYLARIFY) INJECTION
9.0000 | Freq: Once | INTRAVENOUS | Status: AC
  Administered 2022-03-28: 9.78 via INTRAVENOUS

## 2022-03-29 ENCOUNTER — Inpatient Hospital Stay: Payer: Medicare Other

## 2022-03-29 VITALS — BP 149/56 | HR 69 | Temp 96.5°F | Resp 17

## 2022-03-29 DIAGNOSIS — D509 Iron deficiency anemia, unspecified: Secondary | ICD-10-CM | POA: Diagnosis not present

## 2022-03-29 DIAGNOSIS — N184 Chronic kidney disease, stage 4 (severe): Secondary | ICD-10-CM

## 2022-03-29 DIAGNOSIS — D500 Iron deficiency anemia secondary to blood loss (chronic): Secondary | ICD-10-CM

## 2022-03-29 MED ORDER — SODIUM CHLORIDE 0.9 % IV SOLN
INTRAVENOUS | Status: DC
  Filled 2022-03-29: qty 250

## 2022-03-29 MED ORDER — SODIUM CHLORIDE 0.9 % IV SOLN
200.0000 mg | Freq: Once | INTRAVENOUS | Status: AC
  Administered 2022-03-29: 200 mg via INTRAVENOUS
  Filled 2022-03-29: qty 200

## 2022-03-29 NOTE — Patient Instructions (Signed)

## 2022-03-30 ENCOUNTER — Other Ambulatory Visit: Payer: Self-pay | Admitting: Urology

## 2022-03-30 DIAGNOSIS — J852 Abscess of lung without pneumonia: Secondary | ICD-10-CM

## 2022-03-30 DIAGNOSIS — R911 Solitary pulmonary nodule: Secondary | ICD-10-CM

## 2022-03-31 ENCOUNTER — Telehealth: Payer: Self-pay

## 2022-03-31 NOTE — Telephone Encounter (Signed)
Left message to call back  

## 2022-03-31 NOTE — Telephone Encounter (Signed)
-----   Message from [name] Espy, MD sent at 03/30/2022  9:29 AM EDT ----- PET scan is negative for any metastatic disease, does show 2 sites in the prostate consistent with recurrent prostate cancer but only localized.  Would hold off on treatment at this time in the absence of symptoms and we could continue to follow PSA as discussed.  There was an incidental lung nodule identified.  They did recommend repeating a CT in 6 months.  Will  refer you to pulmonary nodule clinic to help follow this lesion (order has been placed).  [name] Espy, MD

## 2022-04-01 NOTE — Telephone Encounter (Signed)
Patient's wife returned call and requested a call back to discuss results.

## 2022-04-01 NOTE — Telephone Encounter (Signed)
Mrs [name] per DPR on file

## 2022-04-05 ENCOUNTER — Inpatient Hospital Stay: Payer: Medicare Other

## 2022-04-05 VITALS — BP 154/61 | HR 69 | Resp 18

## 2022-04-05 DIAGNOSIS — D509 Iron deficiency anemia, unspecified: Secondary | ICD-10-CM | POA: Diagnosis not present

## 2022-04-05 DIAGNOSIS — D500 Iron deficiency anemia secondary to blood loss (chronic): Secondary | ICD-10-CM

## 2022-04-05 MED ORDER — SODIUM CHLORIDE 0.9 % IV SOLN
200.0000 mg | Freq: Once | INTRAVENOUS | Status: AC
  Administered 2022-04-05: 200 mg via INTRAVENOUS
  Filled 2022-04-05: qty 200

## 2022-04-05 MED ORDER — SODIUM CHLORIDE 0.9% FLUSH
10.0000 mL | Freq: Once | INTRAVENOUS | Status: AC | PRN
  Administered 2022-04-05: 10 mL
  Filled 2022-04-05: qty 10

## 2022-04-05 MED ORDER — SODIUM CHLORIDE 0.9 % IV SOLN
Freq: Once | INTRAVENOUS | Status: AC
  Filled 2022-04-05: qty 250

## 2022-04-05 NOTE — Progress Notes (Signed)
Patient tolerated Venofer infusion well, no questions/concerns voiced. Monitored 30 min post transfusion. Patient stable at discharge. VSS. AVS given.    

## 2022-04-05 NOTE — Patient Instructions (Signed)

## 2022-04-09 ENCOUNTER — Encounter: Payer: Self-pay | Admitting: Oncology

## 2022-04-20 ENCOUNTER — Other Ambulatory Visit (INDEPENDENT_AMBULATORY_CARE_PROVIDER_SITE_OTHER): Payer: Self-pay | Admitting: Nurse Practitioner

## 2022-04-20 DIAGNOSIS — I739 Peripheral vascular disease, unspecified: Secondary | ICD-10-CM

## 2022-05-01 DIAGNOSIS — J449 Chronic obstructive pulmonary disease, unspecified: Secondary | ICD-10-CM | POA: Insufficient documentation

## 2022-05-01 DIAGNOSIS — I10 Essential (primary) hypertension: Secondary | ICD-10-CM | POA: Insufficient documentation

## 2022-05-01 DIAGNOSIS — E785 Hyperlipidemia, unspecified: Secondary | ICD-10-CM | POA: Insufficient documentation

## 2022-05-01 DIAGNOSIS — I739 Peripheral vascular disease, unspecified: Secondary | ICD-10-CM | POA: Insufficient documentation

## 2022-05-01 NOTE — Progress Notes (Unsigned)
MRN : [id number]  [name] is a 77 y.o. (29-Jan-1945) male who presents with chief complaint of check circulation.  History of Present Illness:   The patient is seen for evaluation of painful lower extremities. Patient notes the pain is variable and not always associated with activity.  The pain is somewhat consistent day to day occurring on most days. The patient notes the pain also occurs with standing and routinely seems worse as the day wears on. The pain has been progressive over the past several years. The patient states these symptoms are causing  a negative impact on quality of life and daily activities which was a factor in the referral.  The patient has a  history of back problems and DJD of the lumbar and sacral spine.   The patient denies rest pain or dangling of an extremity off the side of the bed during the night for relief. No open wounds or sores at this time. No history of DVT or phlebitis. No prior vascular interventions or surgeries.   No outpatient medications have been marked as taking for the 05/02/22 encounter (Appointment) with Gilda Crease, Latina Craver, MD.    Past Medical History:  Diagnosis Date   Cancer [facility])    COPD (chronic obstructive pulmonary disease) (HCC)    Diabetes mellitus without complication (HCC)    Hyperlipidemia    Hypertension    Left thyroid nodule    Prostate [facility])    Renal insufficiency     Past Surgical History:  Procedure Laterality Date   CARDIOVERSION N/A 11/26/2020   Procedure: CARDIOVERSION;  Surgeon: Lamar Blinks, MD;  Location: [facility] ORS;  Service: Cardiovascular;  Laterality: N/A;   COLONOSCOPY WITH PROPOFOL     COLONOSCOPY WITH PROPOFOL N/A 08/16/2018   Procedure: COLONOSCOPY WITH PROPOFOL;  Surgeon: Christena Deem, MD;  Location: [facility] ENDOSCOPY;  Service: Endoscopy;  Laterality: N/A;   COLONOSCOPY WITH PROPOFOL N/A 12/07/2021   Procedure: COLONOSCOPY WITH PROPOFOL;  Surgeon: Regis Bill, MD;  Location: [facility] ENDOSCOPY;  Service: Endoscopy;  Laterality: N/A;   ESOPHAGOGASTRODUODENOSCOPY (EGD) WITH PROPOFOL N/A 12/07/2021   Procedure: ESOPHAGOGASTRODUODENOSCOPY (EGD) WITH PROPOFOL;  Surgeon: Regis Bill, MD;  Location: [facility] ENDOSCOPY;  Service: Endoscopy;  Laterality: N/A;   insertion of radiation seeds     TEE WITHOUT CARDIOVERSION N/A 11/26/2020   Procedure: TRANSESOPHAGEAL ECHOCARDIOGRAM (TEE);  Surgeon: Lamar Blinks, MD;  Location: [facility] ORS;  Service: Cardiovascular;  Laterality: N/A;    Social History Social History   Tobacco Use   Smoking status: Former    Packs/day: 1.50    Years: 30.00    Additional pack years: 0.00    Total pack years: 45.00    Types: Cigarettes    Quit date: 02/01/1999    Years since quitting: 23.2   Smokeless tobacco: Never  Vaping Use   Vaping Use: Never used  Substance Use Topics   Alcohol use: Not Currently    Comment: 15 years ago   Drug use: Never    Family History Family History  Problem Relation Age of Onset   Diabetes Mother    Hypertension Father     Allergies  Allergen Reactions   Penicillins Swelling   Egg-Derived Products Other (See Comments)     REVIEW OF SYSTEMS (Negative unless checked)  Constitutional: Weight loss  Fever  Chills Cardiac: Chest pain   Chest pressure   Palpitations     Shortness of breath when laying flat   Shortness of breath with exertion. Vascular:  Pain in legs with walking   Pain in legs at rest  History of DVT   Phlebitis   Swelling in legs   Varicose veins   Non-healing ulcers Pulmonary:   Uses home oxygen   Productive cough   Hemoptysis   Wheeze  COPD   Asthma Neurologic:  Dizziness   Seizures   History of stroke   History of TIA  Aphasia   Vissual changes   Weakness or numbness in arm   Weakness or numbness in leg Musculoskeletal:   Joint swelling   Joint pain   Low back pain Hematologic:  Easy  bruising  Easy bleeding   Hypercoagulable state   Anemic Gastrointestinal:  Diarrhea   Vomiting  Gastroesophageal reflux/heartburn   Difficulty swallowing. Genitourinary:  Chronic kidney disease   Difficult urination  Frequent urination   Blood in urine Skin:  Rashes   Ulcers  Psychological:  History of anxiety    History of major depression.  Physical Examination  There were no vitals filed for this visit. There is no height or weight on file to calculate BMI. Gen: WD/WN, NAD Head: [place]/AT, No temporalis wasting.  Ear/Nose/Throat: Hearing grossly intact, nares w/o erythema or drainage Eyes: PER, EOMI, sclera nonicteric.  Neck: Supple, no masses.  No bruit or JVD.  Pulmonary:  Good air movement, no audible wheezing, no use of accessory muscles.  Cardiac: RRR, normal S1, S2, no Murmurs. Vascular:  mild trophic changes, no open wounds Vessel Right Left  Radial Palpable Palpable  PT Not Palpable Not Palpable  DP Not Palpable Not Palpable  Gastrointestinal: soft, non-distended. No guarding/no peritoneal signs.  Musculoskeletal: M/S 5/5 throughout.  No visible deformity.  Neurologic: CN 2-12 intact. Pain and light touch intact in extremities.  Symmetrical.  Speech is fluent. Motor exam as listed above. Psychiatric: Judgment intact, Mood & affect appropriate for pt's clinical situation. Dermatologic: No rashes or ulcers noted.  No changes consistent with cellulitis.   CBC Lab Results  Component Value Date   WBC 9.8 03/10/2022   HGB 10.6 (L) 03/10/2022   HCT 35.8 (L) 03/10/2022   MCV 78.9 (L) 03/10/2022   PLT 231 03/10/2022    BMET    Component Value Date/Time   NA 135 01/31/2018 1628   K 3.9 01/31/2018 1628   CL 99 01/31/2018 1628   CO2 27 01/31/2018 1628   GLUCOSE 171 (H) 01/31/2018 1628   BUN 17 01/31/2018 1628   CREATININE 1.51 (H) 01/31/2018 1628   CALCIUM 8.9 01/31/2018 1628   GFRNONAA 45 (L) 01/31/2018 1628   GFRAA 53 (L) 01/31/2018 1628    CrCl cannot be calculated (Patient's most recent lab result is older than the maximum 21 days allowed.).  COAG Lab Results  Component Value Date   INR 1.2 11/26/2020    Radiology No results found.   Assessment/Plan There are no diagnoses linked to this encounter.   Levora Dredge, MD  05/01/2022 10:39 AM

## 2022-05-02 ENCOUNTER — Ambulatory Visit (INDEPENDENT_AMBULATORY_CARE_PROVIDER_SITE_OTHER): Payer: Self-pay

## 2022-05-02 ENCOUNTER — Ambulatory Visit (INDEPENDENT_AMBULATORY_CARE_PROVIDER_SITE_OTHER): Payer: Medicare Other | Admitting: Vascular Surgery

## 2022-05-02 ENCOUNTER — Encounter (INDEPENDENT_AMBULATORY_CARE_PROVIDER_SITE_OTHER): Payer: Self-pay | Admitting: Vascular Surgery

## 2022-05-02 VITALS — BP 164/71 | HR 72 | Resp 18 | Ht 66.0 in | Wt 263.4 lb

## 2022-05-02 DIAGNOSIS — I890 Lymphedema, not elsewhere classified: Secondary | ICD-10-CM | POA: Diagnosis not present

## 2022-05-02 DIAGNOSIS — J449 Chronic obstructive pulmonary disease, unspecified: Secondary | ICD-10-CM

## 2022-05-02 DIAGNOSIS — I739 Peripheral vascular disease, unspecified: Secondary | ICD-10-CM

## 2022-05-02 DIAGNOSIS — I872 Venous insufficiency (chronic) (peripheral): Secondary | ICD-10-CM | POA: Diagnosis not present

## 2022-05-02 DIAGNOSIS — E782 Mixed hyperlipidemia: Secondary | ICD-10-CM

## 2022-05-02 DIAGNOSIS — I10 Essential (primary) hypertension: Secondary | ICD-10-CM

## 2022-06-15 ENCOUNTER — Inpatient Hospital Stay: Payer: Medicare Other | Attending: Oncology

## 2022-06-15 DIAGNOSIS — D500 Iron deficiency anemia secondary to blood loss (chronic): Secondary | ICD-10-CM

## 2022-06-15 DIAGNOSIS — D509 Iron deficiency anemia, unspecified: Secondary | ICD-10-CM | POA: Insufficient documentation

## 2022-06-15 DIAGNOSIS — Z87891 Personal history of nicotine dependence: Secondary | ICD-10-CM | POA: Diagnosis not present

## 2022-06-15 DIAGNOSIS — Z79899 Other long term (current) drug therapy: Secondary | ICD-10-CM | POA: Insufficient documentation

## 2022-06-15 DIAGNOSIS — C61 Malignant neoplasm of prostate: Secondary | ICD-10-CM | POA: Diagnosis not present

## 2022-06-15 DIAGNOSIS — R5382 Chronic fatigue, unspecified: Secondary | ICD-10-CM | POA: Insufficient documentation

## 2022-06-15 DIAGNOSIS — N184 Chronic kidney disease, stage 4 (severe): Secondary | ICD-10-CM

## 2022-06-15 LAB — CBC WITH DIFFERENTIAL (CANCER CENTER ONLY)
Abs Immature Granulocytes: 0.03 10*3/uL (ref 0.00–0.07)
Basophils Absolute: 0.1 10*3/uL (ref 0.0–0.1)
Basophils Relative: 1 %
Eosinophils Absolute: 0.2 10*3/uL (ref 0.0–0.5)
Eosinophils Relative: 2 %
HCT: 35.9 % — ABNORMAL LOW (ref 39.0–52.0)
Hemoglobin: 11.2 g/dL — ABNORMAL LOW (ref 13.0–17.0)
Immature Granulocytes: 0 %
Lymphocytes Relative: 14 %
Lymphs Abs: 1.1 10*3/uL (ref 0.7–4.0)
MCH: 26.4 pg (ref 26.0–34.0)
MCHC: 31.2 g/dL (ref 30.0–36.0)
MCV: 84.7 fL (ref 80.0–100.0)
Monocytes Absolute: 0.7 10*3/uL (ref 0.1–1.0)
Monocytes Relative: 8 %
Neutro Abs: 6 10*3/uL (ref 1.7–7.7)
Neutrophils Relative %: 75 %
Platelet Count: 216 10*3/uL (ref 150–400)
RBC: 4.24 MIL/uL (ref 4.22–5.81)
RDW: 15.3 % (ref 11.5–15.5)
WBC Count: 8 10*3/uL (ref 4.0–10.5)
nRBC: 0 % (ref 0.0–0.2)

## 2022-06-15 LAB — IRON AND TIBC
Iron: 41 ug/dL — ABNORMAL LOW (ref 45–182)
Saturation Ratios: 13 % — ABNORMAL LOW (ref 17.9–39.5)
TIBC: 326 ug/dL (ref 250–450)
UIBC: 285 ug/dL

## 2022-06-15 LAB — FERRITIN: Ferritin: 61 ng/mL (ref 24–336)

## 2022-06-16 LAB — KAPPA/LAMBDA LIGHT CHAINS
Kappa free light chain: 51.2 mg/L — ABNORMAL HIGH (ref 3.3–19.4)
Kappa, lambda light chain ratio: 1.05 (ref 0.26–1.65)
Lambda free light chains: 48.7 mg/L — ABNORMAL HIGH (ref 5.7–26.3)

## 2022-06-20 ENCOUNTER — Inpatient Hospital Stay (HOSPITAL_BASED_OUTPATIENT_CLINIC_OR_DEPARTMENT_OTHER): Payer: Medicare Other | Admitting: Oncology

## 2022-06-20 ENCOUNTER — Encounter: Payer: Self-pay | Admitting: Oncology

## 2022-06-20 ENCOUNTER — Inpatient Hospital Stay: Payer: Medicare Other

## 2022-06-20 VITALS — BP 162/66 | HR 63 | Temp 96.7°F | Resp 18 | Wt 261.8 lb

## 2022-06-20 VITALS — BP 142/62 | HR 57 | Resp 18

## 2022-06-20 DIAGNOSIS — D500 Iron deficiency anemia secondary to blood loss (chronic): Secondary | ICD-10-CM | POA: Diagnosis not present

## 2022-06-20 DIAGNOSIS — D509 Iron deficiency anemia, unspecified: Secondary | ICD-10-CM | POA: Diagnosis not present

## 2022-06-20 MED ORDER — IRON-VITAMIN C 65-125 MG PO TABS: 1.0000 | ORAL_TABLET | Freq: Every day | ORAL | 3 refills | Status: AC

## 2022-06-20 MED ORDER — SODIUM CHLORIDE 0.9 % IV SOLN
200.0000 mg | Freq: Once | INTRAVENOUS | Status: AC
  Administered 2022-06-20: 200 mg via INTRAVENOUS
  Filled 2022-06-20: qty 200

## 2022-06-20 MED ORDER — SODIUM CHLORIDE 0.9 % IV SOLN
INTRAVENOUS | Status: DC
  Filled 2022-06-20: qty 250

## 2022-06-20 NOTE — Progress Notes (Signed)
Hematology/Oncology Consult note [phone] [phone]         Patient Care Team: Dorothey Baseman, MD as PCP - General (Family Medicine)  REFERRING PROVIDER: Dorothey Baseman, MD   ASSESSMENT & PLAN:   IDA (iron deficiency anemia) Labs are reviewed and discussed with patient. Lab Results  Component Value Date   HGB 11.2 (L) 06/15/2022   TIBC 326 06/15/2022   IRONPCTSAT 13 (L) 06/15/2022   FERRITIN 61 06/15/2022    Recommend IV Venofer 200 mg x 1 to further improve iron store.  After that I recommend patient to start Vitron-C 1 tablet daily as maintenance.     Orders Placed This Encounter  Procedures   CBC with Differential (Cancer Center Only)    Standing Status:   Future    Standing Expiration Date:   06/20/2023   Ferritin    Standing Status:   Future    Standing Expiration Date:   06/20/2023   Iron and TIBC    Standing Status:   Future    Standing Expiration Date:   06/20/2023   Retic Panel    Standing Status:   Future    Standing Expiration Date:   06/20/2023   Follow-up in 4 months. All questions were answered. The patient knows to call the clinic with any problems, questions or concerns.  Rickard Patience, MD, PhD [facility] Hematology Oncology 06/20/2022 '  CHIEF COMPLAINTS/REASON FOR VISIT:  Evaluation of iron deficiency anemia  HISTORY OF PRESENTING ILLNESS:   [name] is a  77 y.o.  male with PMH listed below was seen in consultation at the request of  Dorothey Baseman, MD  for evaluation of anemia. 11/23/2021, CBC showed hemoglobin 8.1, MCV 69.7, hematocrit 29.4, Iron panel showed saturation of 5%, TIBC 457, ferritin 28. 12/08/2021, status post EGD and colonoscopy please find use of erosive gastropathy with no stigmata of recent bleeding.  Biopsy showed very mild antral predominant chronic gastritis.  No significant intestinal metaplastic, dysplastic or glandular atrophic.  Immunohistochemical stain for H. pylori will be  performed.  Results will be issued in an addendum. Patient was accompanied by her daughter today. She reports feeling okay.  Chronic fatigue, unchanged. Patient denies any black stool or bright red blood in the stool.  History of prostate cancer, status post brachytherapy with rising PSA.  He follows up with  urology.  INTERVAL HISTORY [name] is a 77 y.o. male who has above history reviewed by me today presents for follow up visit for iron deficiency anemia.  He tolerates IV venofer. Fatigue has improved.  03/30/2022 repeat PMSA-negative for metastatic disease.  2 sites in the prostate consistent with locally recurrent prostate cancer.  Dr. Apolinar Junes recommends hold off treatment at this point in the absence of symptoms.  Urology will follow-up on PSA  MEDICAL HISTORY:  Past Medical History:  Diagnosis Date   Cancer [facility])    COPD (chronic obstructive pulmonary disease) (HCC)    Diabetes mellitus without complication (HCC)    Hyperlipidemia    Hypertension    Left thyroid nodule    Prostate CA (HCC)    Renal insufficiency     SURGICAL HISTORY: Past Surgical History:  Procedure Laterality Date   CARDIOVERSION N/A 11/26/2020   Procedure: CARDIOVERSION;  Surgeon: Lamar Blinks, MD;  Location: [facility] ORS;  Service: Cardiovascular;  Laterality: N/A;   COLONOSCOPY WITH PROPOFOL     COLONOSCOPY WITH PROPOFOL N/A 08/16/2018   Procedure: COLONOSCOPY WITH PROPOFOL;  Surgeon: Marva Panda,  Cindra Eves, MD;  Location: [facility] ENDOSCOPY;  Service: Endoscopy;  Laterality: N/A;   COLONOSCOPY WITH PROPOFOL N/A 12/07/2021   Procedure: COLONOSCOPY WITH PROPOFOL;  Surgeon: Regis Bill, MD;  Location: [facility] ENDOSCOPY;  Service: Endoscopy;  Laterality: N/A;   ESOPHAGOGASTRODUODENOSCOPY (EGD) WITH PROPOFOL N/A 12/07/2021   Procedure: ESOPHAGOGASTRODUODENOSCOPY (EGD) WITH PROPOFOL;  Surgeon: Regis Bill, MD;  Location: [facility] ENDOSCOPY;  Service: Endoscopy;  Laterality: N/A;   insertion of  radiation seeds     TEE WITHOUT CARDIOVERSION N/A 11/26/2020   Procedure: TRANSESOPHAGEAL ECHOCARDIOGRAM (TEE);  Surgeon: Lamar Blinks, MD;  Location: [facility] ORS;  Service: Cardiovascular;  Laterality: N/A;    SOCIAL HISTORY: Social History   Socioeconomic History   Marital status: Married    Spouse name: Not on file   Number of children: Not on file   Years of education: Not on file   Highest education level: Not on file  Occupational History   Not on file  Tobacco Use   Smoking status: Former    Packs/day: 1.50    Years: 30.00    Additional pack years: 0.00    Total pack years: 45.00    Types: Cigarettes    Quit date: 02/01/1999    Years since quitting: 23.3   Smokeless tobacco: Never  Vaping Use   Vaping Use: Never used  Substance and Sexual Activity   Alcohol use: Not Currently    Comment: 15 years ago   Drug use: Never   Sexual activity: Not on file  Other Topics Concern   Not on file  Social History Narrative   Not on file   Social Determinants of Health   Financial Resource Strain: Not on file  Food Insecurity: Not on file  Transportation Needs: Not on file  Physical Activity: Not on file  Stress: Not on file  Social Connections: Not on file  Intimate Partner Violence: Not on file    FAMILY HISTORY: Family History  Problem Relation Age of Onset   Diabetes Mother    Hypertension Father     ALLERGIES:  is allergic to penicillins and egg-derived products.  MEDICATIONS:  Current Outpatient Medications  Medication Sig Dispense Refill   albuterol (PROVENTIL HFA;VENTOLIN HFA) 108 (90 Base) MCG/ACT inhaler Inhale 2 puffs into the lungs every 6 (six) hours as needed for wheezing or shortness of breath. 1 Inhaler 2   amLODipine (NORVASC) 5 MG tablet Take 5 mg by mouth daily.     apixaban (ELIQUIS) 5 MG TABS tablet Take 1 tablet (5 mg total) by mouth 2 (two) times daily. 60 tablet 6   aspirin EC 81 MG tablet Take 81 mg by mouth daily.     atorvastatin  (LIPITOR) 40 MG tablet Take 40 mg by mouth daily.     Fluticasone-Salmeterol (ADVAIR) 250-50 MCG/DOSE AEPB INHALE 1 PUFF INTO THE LUNGS EVERY 12 HOURS     furosemide (LASIX) 20 MG tablet TAKE 1 TABLET(20 MG) BY MOUTH EVERY DAY     hydrALAZINE (APRESOLINE) 50 MG tablet Take 50 mg by mouth 3 (three) times daily.     Iron-Vitamin C 65-125 MG TABS Take 1 tablet by mouth daily. 30 tablet 3   losartan (COZAAR) 100 MG tablet Take 100 mg by mouth daily.     metFORMIN (GLUCOPHAGE) 850 MG tablet Take 850 mg by mouth 2 (two) times daily.     metoprolol tartrate (LOPRESSOR) 100 MG tablet Take 50 mg by mouth 2 (two) times daily.     sertraline (  ZOLOFT) 100 MG tablet Take 100 mg by mouth daily.     sildenafil (REVATIO) 20 MG tablet Take 100 mg by mouth as needed.     SYMBICORT 80-4.5 MCG/ACT inhaler Inhale 2 puffs into the lungs 2 (two) times daily.     tadalafil (CIALIS) 20 MG tablet Take 20 mg by mouth as needed.     traZODone (DESYREL) 100 MG tablet Take 100 mg by mouth at bedtime as needed for sleep.     No current facility-administered medications for this visit.   Facility-Administered Medications Ordered in Other Visits  Medication Dose Route Frequency Provider Last Rate Last Admin   0.9 %  sodium chloride infusion   Intravenous Continuous Rickard Patience, MD   Stopped at 06/20/22 1536    Review of Systems  Constitutional:  Negative for appetite change, chills, fever and unexpected weight change.  HENT:   Negative for hearing loss and voice change.   Eyes:  Negative for eye problems and icterus.  Respiratory:  Negative for chest tightness, cough and shortness of breath.   Cardiovascular:  Negative for chest pain and leg swelling.  Gastrointestinal:  Negative for abdominal distention and abdominal pain.  Endocrine: Negative for hot flashes.  Genitourinary:  Negative for difficulty urinating, dysuria and frequency.   Musculoskeletal:  Negative for arthralgias.  Skin:  Negative for itching and rash.   Neurological:  Negative for light-headedness and numbness.  Hematological:  Negative for adenopathy. Does not bruise/bleed easily.  Psychiatric/Behavioral:  Negative for confusion.    PHYSICAL EXAMINATION: ECOG PERFORMANCE STATUS: 1 - Symptomatic but completely ambulatory Vitals:   06/20/22 1413  BP: (!) 162/66  Pulse: 63  Resp: 18  Temp: (!) 96.7 F (35.9 C)   Filed Weights   06/20/22 1413  Weight: 261 lb 12.8 oz (118.8 kg)    Physical Exam Constitutional:      General: He is not in acute distress.    Appearance: He is obese.  HENT:     Head: Normocephalic and atraumatic.  Eyes:     General: No scleral icterus. Cardiovascular:     Rate and Rhythm: Normal rate.  Pulmonary:     Effort: Pulmonary effort is normal. No respiratory distress.     Breath sounds: No wheezing.  Abdominal:     General: Bowel sounds are normal. There is no distension.     Palpations: Abdomen is soft.  Musculoskeletal:        General: No deformity. Normal range of motion.     Cervical back: Normal range of motion and neck supple.  Skin:    General: Skin is warm and dry.     Findings: No erythema or rash.  Neurological:     Mental Status: He is alert and oriented to person, place, and time. Mental status is at baseline.     Cranial Nerves: No cranial nerve deficit.  Psychiatric:        Mood and Affect: Mood normal.     LABORATORY DATA:  I have reviewed the data as listed    Latest Ref Rng & Units 06/15/2022   11:48 AM 03/10/2022   10:22 AM 01/31/2018    4:28 PM  CBC  WBC 4.0 - 10.5 K/uL 8.0  9.8  14.0   Hemoglobin 13.0 - 17.0 g/dL 16.1  09.6  04.5   Hematocrit 39.0 - 52.0 % 35.9  35.8  41.1   Platelets 150 - 400 K/uL 216  231  272  Latest Ref Rng & Units 01/31/2018    4:28 PM  CMP  Glucose 70 - 99 mg/dL 161   BUN 8 - 23 mg/dL 17   Creatinine 0.96 - 1.24 mg/dL 0.45   Sodium 409 - 811 mmol/L 135   Potassium 3.5 - 5.1 mmol/L 3.9   Chloride 98 - 111 mmol/L 99   CO2 22 - 32  mmol/L 27   Calcium 8.9 - 10.3 mg/dL 8.9       RADIOGRAPHIC STUDIES: I have personally reviewed the radiological images as listed and agreed with the findings in the report. VAS [place] ABI WITH/WO TBI  Result Date: 05/09/2022  LOWER EXTREMITY DOPPLER STUDY Patient Name:  JACYN MUENCHOW  Date of Exam:   05/02/2022 Medical Rec #: [id number]         Accession #:    [id number] Date of Birth: 1945/07/27        Patient Gender: M Patient Age:   74 years Exam Location:  [facility] Procedure:      VAS [place] ABI WITH/WO TBI Referring Phys: --------------------------------------------------------------------------------  Indications: Rest pain. severe swelling  Performing Technologist: Salvadore Farber RVT  Examination Guidelines: A complete evaluation includes at minimum, Doppler waveform signals and systolic blood pressure reading at the level of bilateral brachial, anterior tibial, and posterior tibial arteries, when vessel segments are accessible. Bilateral testing is considered an integral part of a complete examination. Photoelectric Plethysmograph (PPG) waveforms and toe systolic pressure readings are included as required and additional duplex testing as needed. Limited examinations for reoccurring indications may be performed as noted.  ABI Findings: +---------+------------------+-----+---------+--------+ Right    Rt Pressure (mmHg)IndexWaveform Comment  +---------+------------------+-----+---------+--------+ Brachial 163                                      +---------+------------------+-----+---------+--------+ ATA                             biphasic NonComp  +---------+------------------+-----+---------+--------+ PTA                             triphasicNonComp  +---------+------------------+-----+---------+--------+ Great Toe125               0.76 Normal            +---------+------------------+-----+---------+--------+  +---------+------------------+-----+---------+-------+ Left     Lt Pressure (mmHg)IndexWaveform Comment +---------+------------------+-----+---------+-------+ Brachial 165                                     +---------+------------------+-----+---------+-------+ ATA                             triphasicNonComp +---------+------------------+-----+---------+-------+ PTA                             triphasicNonComp +---------+------------------+-----+---------+-------+ Great Toe117               0.71 Normal           +---------+------------------+-----+---------+-------+  Summary: Right: Resting right ankle-brachial index indicates noncompressible right lower extremity arteries. The right toe-brachial index is normal. NonCompressible vessels due to severe swelling in lower extremities. Normal multiphasic waveforms. Left: Resting left ankle-brachial  index indicates noncompressible left lower extremity arteries. The left toe-brachial index is normal. NonCompressible vessels due to severe swelling in lower extremities. Normal multiphasic waveforms. *See table(s) above for measurements and observations.  Electronically signed by Levora Dredge MD on 05/09/2022 at 11:14:22 AM.    Final    NM PET (PSMA) SKULL TO MID THIGH  Result Date: 03/30/2022 CLINICAL DATA:  Prostate carcinoma with biochemical recurrence. Previous prostate brachytherapy. EXAM: NUCLEAR MEDICINE PET SKULL BASE TO THIGH TECHNIQUE: 9.8 mCi F18 Piflufolastat (Pylarify) was injected intravenously. Full-ring PET imaging was performed from the skull base to thigh after the radiotracer. CT data was obtained and used for attenuation correction and anatomic localization. COMPARISON:  02/27/2020 FINDINGS: NECK No radiotracer activity in neck lymph nodes. Incidental CT finding: Aortic and coronary atherosclerotic calcification incidentally noted. CHEST No radiotracer accumulation within mediastinal or hilar lymph nodes. No pulmonary  nodules on the CT scan which are suspicious for metastases. A focal ground-glass opacity is seen in the anterior right midlung which measures 11 mm on image 77/4. Differential diagnosis includes inflammatory etiology and low-grade bronchogenic adenocarcinoma. Incidental CT finding: None. ABDOMEN/PELVIS Prostate: Prostate brachytherapy seeds are again seen. Focal radiotracer accumulation again seen in the left prostate base, with SUV max of 13.1. A 2nd smaller area of radiotracer accumulation is seen in the central posterior mid gland. These findings are consistent with site of recurrent prostate carcinoma. Lymph nodes: No abnormal radiotracer accumulation within pelvic or abdominal nodes. Liver: No evidence of liver metastasis. Incidental CT finding: Moderate bilateral scrotal hydroceles, without significant change. SKELETON No focal activity to suggest skeletal metastasis. IMPRESSION: Two focal sites of radiotracer accumulation in the left base and central posterior mid gland in the prostate, consistent with recurrent prostate carcinoma. No evidence of nodal or distant metastatic disease. 11 mm ground-glass opacity in anterior right midlung. Differential diagnosis includes inflammatory etiologies and low-grade bronchogenic adenocarcinoma. Initial follow-up with CT at 6 months is recommended to confirm persistence. If persistent, repeat CT is recommended every 2 years until 5 years of stability has been established. This recommendation follows the consensus statement: Guidelines for Management of Incidental Pulmonary Nodules Detected on CT Images: From the Fleischner Society 2017; Radiology 2017; 284:228-243. Electronically Signed   By: Danae Orleans M.D.   On: 03/30/2022 08:46

## 2022-06-20 NOTE — Progress Notes (Signed)
Pt has been educated and understands. Pt declined to stay 30 mins after iron infusion. VSS.  

## 2022-06-20 NOTE — Assessment & Plan Note (Signed)
Labs are reviewed and discussed with patient. Lab Results  Component Value Date   HGB 11.2 (L) 06/15/2022   TIBC 326 06/15/2022   IRONPCTSAT 13 (L) 06/15/2022   FERRITIN 61 06/15/2022    Recommend IV Venofer 200 mg x 1 to further improve iron store.  After that I recommend patient to start Vitron-C 1 tablet daily as maintenance.

## 2022-06-21 LAB — MULTIPLE MYELOMA PANEL, SERUM
Albumin SerPl Elph-Mcnc: 3.1 g/dL (ref 2.9–4.4)
Albumin/Glob SerPl: 1.1 (ref 0.7–1.7)
Alpha 1: 0.4 g/dL (ref 0.0–0.4)
Alpha2 Glob SerPl Elph-Mcnc: 0.7 g/dL (ref 0.4–1.0)
B-Globulin SerPl Elph-Mcnc: 1 g/dL (ref 0.7–1.3)
Gamma Glob SerPl Elph-Mcnc: 0.9 g/dL (ref 0.4–1.8)
Globulin, Total: 2.9 g/dL (ref 2.2–3.9)
IgA: 196 mg/dL (ref 61–437)
IgG (Immunoglobin G), Serum: 962 mg/dL (ref 603–1613)
IgM (Immunoglobulin M), Srm: 32 mg/dL (ref 15–143)
Total Protein ELP: 6 g/dL (ref 6.0–8.5)

## 2022-08-02 ENCOUNTER — Other Ambulatory Visit (INDEPENDENT_AMBULATORY_CARE_PROVIDER_SITE_OTHER): Payer: Self-pay | Admitting: Vascular Surgery

## 2022-08-02 DIAGNOSIS — I872 Venous insufficiency (chronic) (peripheral): Secondary | ICD-10-CM

## 2022-08-03 NOTE — Progress Notes (Unsigned)
MRN : [id number]  [name] is a 77 y.o. (26-Aug-1945) male who presents with chief complaint of legs swell.  History of Present Illness:   Patient is seen for evaluation of leg swelling associated with severe skin changes. The patient first noticed the swelling remotely but is now concerned because of a significant increase in the overall edema. The swelling isn't associated with significant pain.  There has been an increasing amount of  discoloration noted by the patient.    There is no history of ulcerations associated with the swelling but he does note that every once in a while he does leak fluid.  He denies past episodes of cellulitis.   The patient denies any recent changes in their medications.   The patient has not been wearing graduated compression.   The patient has no had any past angiography, interventions or vascular surgery.   The patient denies a history of DVT or PE. There is no prior history of phlebitis. There is no history of primary lymphedema.   There is no history of radiation treatment to the groin or pelvis No history of malignancies. No history of trauma or groin or pelvic surgery. No history of foreign travel or parasitic infections area    Previous ABI's obtained today show triphasic waveforms bilaterally with normal TBI's and non-compressibility at the ankle.  Duplex ultrasound of the venous system bilateral lower extremities obtained today demonstrates normal deep venous system bilaterally no evidence of prior DVT or other obstruction.  There is no deep or superficial reflux identified. No outpatient medications have been marked as taking for the 08/04/22 encounter (Appointment) with Gilda Crease, Latina Craver, MD.    Past Medical History:  Diagnosis Date   Cancer [facility])    COPD (chronic obstructive pulmonary disease) (HCC)    Diabetes mellitus without complication (HCC)    Hyperlipidemia    Hypertension    Left thyroid  nodule    Prostate CA [facility])    Renal insufficiency     Past Surgical History:  Procedure Laterality Date   CARDIOVERSION N/A 11/26/2020   Procedure: CARDIOVERSION;  Surgeon: Lamar Blinks, MD;  Location: [facility] ORS;  Service: Cardiovascular;  Laterality: N/A;   COLONOSCOPY WITH PROPOFOL     COLONOSCOPY WITH PROPOFOL N/A 08/16/2018   Procedure: COLONOSCOPY WITH PROPOFOL;  Surgeon: Christena Deem, MD;  Location: [facility] ENDOSCOPY;  Service: Endoscopy;  Laterality: N/A;   COLONOSCOPY WITH PROPOFOL N/A 12/07/2021   Procedure: COLONOSCOPY WITH PROPOFOL;  Surgeon: Regis Bill, MD;  Location: [facility] ENDOSCOPY;  Service: Endoscopy;  Laterality: N/A;   ESOPHAGOGASTRODUODENOSCOPY (EGD) WITH PROPOFOL N/A 12/07/2021   Procedure: ESOPHAGOGASTRODUODENOSCOPY (EGD) WITH PROPOFOL;  Surgeon: Regis Bill, MD;  Location: [facility] ENDOSCOPY;  Service: Endoscopy;  Laterality: N/A;   insertion of radiation seeds     TEE WITHOUT CARDIOVERSION N/A 11/26/2020   Procedure: TRANSESOPHAGEAL ECHOCARDIOGRAM (TEE);  Surgeon: Lamar Blinks, MD;  Location: [facility] ORS;  Service: Cardiovascular;  Laterality: N/A;    Social History Social History   Tobacco Use   Smoking status: Former    Current packs/day: 0.00    Average packs/day: 1.5 packs/day for 30.0 years (45.0 ttl pk-yrs)    Types: Cigarettes    Start date: 01/31/1969    Quit date: 02/01/1999    Years since quitting: 23.5   Smokeless  tobacco: Never  Vaping Use   Vaping status: Never Used  Substance Use Topics   Alcohol use: Not Currently    Comment: 15 years ago   Drug use: Never    Family History Family History  Problem Relation Age of Onset   Diabetes Mother    Hypertension Father     Allergies  Allergen Reactions   Penicillins Swelling   Egg-Derived Products Other (See Comments)     REVIEW OF SYSTEMS (Negative unless checked)  Constitutional: [] Weight loss  [] Fever  [] Chills Cardiac: [] Chest pain   [] Chest pressure    [] Palpitations   [] Shortness of breath when laying flat   [] Shortness of breath with exertion. Vascular:  [] Pain in legs with walking   [x] Pain in legs with standing  [] History of DVT   [] Phlebitis   [x] Swelling in legs   [] Varicose veins   [] Non-healing ulcers Pulmonary:   [] Uses home oxygen   [] Productive cough   [] Hemoptysis   [] Wheeze  [] COPD   [] Asthma Neurologic:  [] Dizziness   [] Seizures   [] History of stroke   [] History of TIA  [] Aphasia   [] Vissual changes   [] Weakness or numbness in arm   [] Weakness or numbness in leg Musculoskeletal:   [] Joint swelling   [] Joint pain   [] Low back pain Hematologic:  [] Easy bruising  [] Easy bleeding   [] Hypercoagulable state   [] Anemic Gastrointestinal:  [] Diarrhea   [] Vomiting  [] Gastroesophageal reflux/heartburn   [] Difficulty swallowing. Genitourinary:  [] Chronic kidney disease   [] Difficult urination  [] Frequent urination   [] Blood in urine Skin:  [] Rashes   [] Ulcers  Psychological:  [] History of anxiety   []  History of major depression.  Physical Examination  There were no vitals filed for this visit. There is no height or weight on file to calculate BMI. Gen: WD/WN, NAD Head: [place]/AT, No temporalis wasting.  Ear/Nose/Throat: Hearing grossly intact, nares w/o erythema or drainage, pinna without lesions Eyes: PER, EOMI, sclera nonicteric.  Neck: Supple, no gross masses.  No JVD.  Pulmonary:  Good air movement, no audible wheezing, no use of accessory muscles.  Cardiac: RRR, precordium not hyperdynamic. Vascular:  scattered varicosities present bilaterally.  Mild venous stasis changes to the legs bilaterally.  3-4+ soft pitting edema, CEAP C5sEpAsPr left ankle ulcer Vessel Right Left  Radial Palpable Palpable  Gastrointestinal: soft, non-distended. No guarding/no peritoneal signs.  Musculoskeletal: M/S 5/5 throughout.  No deformity.  Neurologic: CN 2-12 intact. Pain and light touch intact in extremities.  Symmetrical.  Speech is fluent. Motor  exam as listed above. Psychiatric: Judgment intact, Mood & affect appropriate for pt's clinical situation. Dermatologic: Venous rashes left ankle ulcer noted.  No changes consistent with cellulitis. Lymph : No lichenification or skin changes of chronic lymphedema.  CBC Lab Results  Component Value Date   WBC 8.0 06/15/2022   HGB 11.2 (L) 06/15/2022   HCT 35.9 (L) 06/15/2022   MCV 84.7 06/15/2022   PLT 216 06/15/2022    BMET    Component Value Date/Time   NA 135 01/31/2018 1628   K 3.9 01/31/2018 1628   CL 99 01/31/2018 1628   CO2 27 01/31/2018 1628   GLUCOSE 171 (H) 01/31/2018 1628   BUN 17 01/31/2018 1628   CREATININE 1.51 (H) 01/31/2018 1628   CALCIUM 8.9 01/31/2018 1628   GFRNONAA 45 (L) 01/31/2018 1628   GFRAA 53 (L) 01/31/2018 1628   CrCl cannot be calculated (Patient's most recent lab result is older than the maximum 21 days allowed.).  COAG  Lab Results  Component Value Date   INR 1.2 11/26/2020    Radiology No results found.   Assessment/Plan 1. Lymphedema Recommend:  No surgery or intervention at this point in time.   The Patient is CEAP C4sEpAsPr.  The patient has been wearing compression for more than 12 weeks with no or little benefit.  The patient has been exercising daily for more than 12 weeks. The patient has been elevating and taking OTC pain medications for more than 12 weeks.  None of these have have eliminated the pain related to the lymphedema or the discomfort regarding excessive swelling and venous congestion.    I have reviewed my discussion with the patient regarding lymphedema and why it  causes symptoms.  Patient will continue wearing graduated compression on a daily basis. The patient should put the compression on first thing in the morning and removing them in the evening. The patient should not sleep in the compression.   In addition, behavioral modification throughout the day will be continued.  This will include frequent elevation  (such as in a recliner), use of over the counter pain medications as needed and exercise such as walking.  The systemic causes for chronic edema such as liver, kidney and cardiac etiologies do not appear to have significant changed over the past year.    The patient has chronic , severe lymphedema with hyperpigmentation of the skin and has done MLD, skin care, medication, diet, exercise, elevation and compression for 4 weeks with no improvement,  I am recommending a lymphedema pump.  The patient still has stage 3 lymphedema and therefore, I believe that a lymph pump is needed to improve the control of the patient's lymphedema and improve the quality of life.  Additionally, a lymph pump is warranted because it will reduce the risk of cellulitis and ulceration in the future.  Patient should follow-up in six months   2. Chronic venous insufficiency Recommend:  No surgery or intervention at this point in time.   The Patient is CEAP C4sEpAsPr.  The patient has been wearing compression for more than 12 weeks with no or little benefit.  The patient has been exercising daily for more than 12 weeks. The patient has been elevating and taking OTC pain medications for more than 12 weeks.  None of these have have eliminated the pain related to the lymphedema or the discomfort regarding excessive swelling and venous congestion.    I have reviewed my discussion with the patient regarding lymphedema and why it  causes symptoms.  Patient will continue wearing graduated compression on a daily basis. The patient should put the compression on first thing in the morning and removing them in the evening. The patient should not sleep in the compression.   In addition, behavioral modification throughout the day will be continued.  This will include frequent elevation (such as in a recliner), use of over the counter pain medications as needed and exercise such as walking.  The systemic causes for chronic edema such as  liver, kidney and cardiac etiologies do not appear to have significant changed over the past year.    The patient has chronic , severe lymphedema with hyperpigmentation of the skin and has done MLD, skin care, medication, diet, exercise, elevation and compression for 4 weeks with no improvement,  I am recommending a lymphedema pump.  The patient still has stage 3 lymphedema and therefore, I believe that a lymph pump is needed to improve the control of the patient's lymphedema  and improve the quality of life.  Additionally, a lymph pump is warranted because it will reduce the risk of cellulitis and ulceration in the future.  Patient should follow-up in six months   3. PAD (peripheral artery disease) (HCC) Recommend:  I do not find evidence of life style limiting vascular disease. The patient specifically denies life style limitation.  Previous noninvasive studies including ABI's of the legs do not identify critical vascular problems.  The patient should continue walking and begin a more formal exercise program. The patient should continue his antiplatelet therapy and aggressive treatment of the lipid abnormalities.  The patient is instructed to call the office if there is a significant change in the lower extremity symptoms, particularly if a wound develops or there is an abrupt increase in leg pain.  4. Essential hypertension Continue antihypertensive medications as already ordered, these medications have been reviewed and there are no changes at this time.  5. Chronic obstructive pulmonary disease, unspecified COPD type (HCC) Continue pulmonary medications and aerosols as already ordered, these medications have been reviewed and there are no changes at this time.   6. Mixed hyperlipidemia Continue statin as ordered and reviewed, no changes at this time    Levora Dredge, MD  08/03/2022 3:23 PM

## 2022-08-04 ENCOUNTER — Ambulatory Visit (INDEPENDENT_AMBULATORY_CARE_PROVIDER_SITE_OTHER): Payer: Medicare Other

## 2022-08-04 ENCOUNTER — Encounter (INDEPENDENT_AMBULATORY_CARE_PROVIDER_SITE_OTHER): Payer: Self-pay | Admitting: Vascular Surgery

## 2022-08-04 ENCOUNTER — Ambulatory Visit (INDEPENDENT_AMBULATORY_CARE_PROVIDER_SITE_OTHER): Payer: Medicare Other | Admitting: Vascular Surgery

## 2022-08-04 VITALS — BP 155/65 | HR 68 | Resp 16 | Wt 256.0 lb

## 2022-08-04 DIAGNOSIS — I890 Lymphedema, not elsewhere classified: Secondary | ICD-10-CM | POA: Diagnosis not present

## 2022-08-04 DIAGNOSIS — I739 Peripheral vascular disease, unspecified: Secondary | ICD-10-CM | POA: Diagnosis not present

## 2022-08-04 DIAGNOSIS — I872 Venous insufficiency (chronic) (peripheral): Secondary | ICD-10-CM

## 2022-08-04 DIAGNOSIS — I10 Essential (primary) hypertension: Secondary | ICD-10-CM

## 2022-08-04 DIAGNOSIS — E782 Mixed hyperlipidemia: Secondary | ICD-10-CM

## 2022-08-04 DIAGNOSIS — J449 Chronic obstructive pulmonary disease, unspecified: Secondary | ICD-10-CM

## 2022-10-18 ENCOUNTER — Inpatient Hospital Stay: Payer: Medicare Other | Attending: Oncology

## 2022-10-18 DIAGNOSIS — D509 Iron deficiency anemia, unspecified: Secondary | ICD-10-CM | POA: Insufficient documentation

## 2022-10-18 DIAGNOSIS — Z79899 Other long term (current) drug therapy: Secondary | ICD-10-CM | POA: Insufficient documentation

## 2022-10-18 DIAGNOSIS — D500 Iron deficiency anemia secondary to blood loss (chronic): Secondary | ICD-10-CM

## 2022-10-18 LAB — IRON AND TIBC
Iron: 43 ug/dL — ABNORMAL LOW (ref 45–182)
Saturation Ratios: 13 % — ABNORMAL LOW (ref 17.9–39.5)
TIBC: 329 ug/dL (ref 250–450)
UIBC: 286 ug/dL

## 2022-10-18 LAB — CBC WITH DIFFERENTIAL (CANCER CENTER ONLY)
Abs Immature Granulocytes: 0.15 10*3/uL — ABNORMAL HIGH (ref 0.00–0.07)
Basophils Absolute: 0.1 10*3/uL (ref 0.0–0.1)
Basophils Relative: 1 %
Eosinophils Absolute: 0.3 10*3/uL (ref 0.0–0.5)
Eosinophils Relative: 3 %
HCT: 36.3 % — ABNORMAL LOW (ref 39.0–52.0)
Hemoglobin: 11 g/dL — ABNORMAL LOW (ref 13.0–17.0)
Immature Granulocytes: 2 %
Lymphocytes Relative: 12 %
Lymphs Abs: 1.2 10*3/uL (ref 0.7–4.0)
MCH: 26.6 pg (ref 26.0–34.0)
MCHC: 30.3 g/dL (ref 30.0–36.0)
MCV: 87.9 fL (ref 80.0–100.0)
Monocytes Absolute: 1.1 10*3/uL — ABNORMAL HIGH (ref 0.1–1.0)
Monocytes Relative: 11 %
Neutro Abs: 7 10*3/uL (ref 1.7–7.7)
Neutrophils Relative %: 71 %
Platelet Count: 259 10*3/uL (ref 150–400)
RBC: 4.13 MIL/uL — ABNORMAL LOW (ref 4.22–5.81)
RDW: 15.1 % (ref 11.5–15.5)
WBC Count: 9.8 10*3/uL (ref 4.0–10.5)
nRBC: 0 % (ref 0.0–0.2)

## 2022-10-18 LAB — RETIC PANEL
Immature Retic Fract: 21.5 % — ABNORMAL HIGH (ref 2.3–15.9)
RBC.: 4.12 MIL/uL — ABNORMAL LOW (ref 4.22–5.81)
Retic Count, Absolute: 81.2 10*3/uL (ref 19.0–186.0)
Retic Ct Pct: 2 % (ref 0.4–3.1)
Reticulocyte Hemoglobin: 28.4 pg (ref >27.9)

## 2022-10-18 LAB — FERRITIN: Ferritin: 77 ng/mL (ref 24–336)

## 2022-10-20 ENCOUNTER — Encounter: Payer: Self-pay | Admitting: Oncology

## 2022-10-20 ENCOUNTER — Inpatient Hospital Stay: Payer: Medicare Other | Admitting: Oncology

## 2022-10-20 ENCOUNTER — Inpatient Hospital Stay: Payer: Medicare Other

## 2022-10-20 VITALS — BP 135/82

## 2022-10-20 VITALS — BP 137/53 | HR 71 | Temp 97.6°F | Resp 18 | Wt 261.6 lb

## 2022-10-20 DIAGNOSIS — D509 Iron deficiency anemia, unspecified: Secondary | ICD-10-CM | POA: Diagnosis not present

## 2022-10-20 DIAGNOSIS — D500 Iron deficiency anemia secondary to blood loss (chronic): Secondary | ICD-10-CM

## 2022-10-20 DIAGNOSIS — N184 Chronic kidney disease, stage 4 (severe): Secondary | ICD-10-CM

## 2022-10-20 MED ORDER — HEPARIN SOD (PORK) LOCK FLUSH 100 UNIT/ML IV SOLN
500.0000 [IU] | Freq: Once | INTRAVENOUS | Status: DC | PRN
  Filled 2022-10-20: qty 5

## 2022-10-20 MED ORDER — SODIUM CHLORIDE 0.9 % IV SOLN
INTRAVENOUS | Status: DC
  Filled 2022-10-20: qty 250

## 2022-10-20 MED ORDER — HEPARIN SOD (PORK) LOCK FLUSH 100 UNIT/ML IV SOLN
250.0000 [IU] | Freq: Once | INTRAVENOUS | Status: DC | PRN
  Filled 2022-10-20: qty 5

## 2022-10-20 MED ORDER — SODIUM CHLORIDE 0.9% FLUSH
10.0000 mL | Freq: Once | INTRAVENOUS | Status: DC | PRN
  Filled 2022-10-20: qty 10

## 2022-10-20 MED ORDER — SODIUM CHLORIDE 0.9% FLUSH
3.0000 mL | Freq: Once | INTRAVENOUS | Status: DC | PRN
  Filled 2022-10-20: qty 3

## 2022-10-20 MED ORDER — SODIUM CHLORIDE 0.9 % IV SOLN
200.0000 mg | Freq: Once | INTRAVENOUS | Status: AC
  Administered 2022-10-20: 200 mg via INTRAVENOUS
  Filled 2022-10-20: qty 200

## 2022-10-20 MED ORDER — ALTEPLASE 2 MG IJ SOLR
2.0000 mg | Freq: Once | INTRAMUSCULAR | Status: DC | PRN
  Filled 2022-10-20: qty 2

## 2022-10-20 NOTE — Progress Notes (Signed)
Pt here for follow up. No new concerns voiced.   

## 2022-10-20 NOTE — Progress Notes (Signed)
Hematology/Oncology Consult note [phone] [phone]         Patient Care Team: Dorothey Baseman, MD as PCP - General (Family Medicine)  REFERRING PROVIDER: Dorothey Baseman, MD   ASSESSMENT & PLAN:   IDA (iron deficiency anemia) Labs are reviewed and discussed with patient. Lab Results  Component Value Date   HGB 11.0 (L) 10/18/2022   TIBC 329 10/18/2022   IRONPCTSAT 13 (L) 10/18/2022   FERRITIN 77 10/18/2022    Recommend IV Venofer 200 mg weekly x 3 to further improve iron store.   After that continue Vitron-C 1 tablet daily as maintenance.    Chronic kidney disease (CKD), active medical management without dialysis, stage 4 (severe) (HCC) Avoid nephrotoxins. There might be a component of anemia secondary to chronic kidney disease.   Orders Placed This Encounter  Procedures   CBC with Differential (Cancer Center Only)    Standing Status:   Future    Standing Expiration Date:   10/20/2023   Ferritin    Standing Status:   Future    Standing Expiration Date:   10/20/2023   Iron and TIBC    Standing Status:   Future    Standing Expiration Date:   10/20/2023   Follow-up in 4 months. All questions were answered. The patient knows to call the clinic with any problems, questions or concerns.  Rickard Patience, MD, PhD [facility] Hematology Oncology 10/20/2022 '  CHIEF COMPLAINTS/REASON FOR VISIT:  Evaluation of iron deficiency anemia  HISTORY OF PRESENTING ILLNESS:   [name] is a  77 y.o.  male with PMH listed below was seen in consultation at the request of  Dorothey Baseman, MD  for evaluation of anemia. 11/23/2021, CBC showed hemoglobin 8.1, MCV 69.7, hematocrit 29.4, Iron panel showed saturation of 5%, TIBC 457, ferritin 28. 12/08/2021, status post EGD and colonoscopy please find use of erosive gastropathy with no stigmata of recent bleeding.  Biopsy showed very mild antral predominant chronic gastritis.  No significant intestinal  metaplastic, dysplastic or glandular atrophic.  Immunohistochemical stain for H. pylori will be performed.  Results will be issued in an addendum. Patient was accompanied by her daughter today. She reports feeling okay.  Chronic fatigue, unchanged. Patient denies any black stool or bright red blood in the stool.  History of prostate cancer, status post brachytherapy with rising PSA.  He follows up with  urology. 03/30/2022 repeat PMSA-negative for metastatic disease.  2 sites in the prostate consistent with locally recurrent prostate cancer.  Dr. Apolinar Junes recommends hold off treatment at this point in the absence of symptoms.  Urology will follow-up on PSA  INTERVAL HISTORY [name] is a 77 y.o. male who has above history reviewed by me today presents for follow up visit for iron deficiency anemia.  He tolerates IV venofer. Fatigue has improved.  He follows up with urology for prostate cancer surveillance.   MEDICAL HISTORY:  Past Medical History:  Diagnosis Date   Cancer (HCC)    COPD (chronic obstructive pulmonary disease) (HCC)    Diabetes mellitus without complication (HCC)    Hyperlipidemia    Hypertension    Left thyroid nodule    Prostate CA (HCC)    Renal insufficiency     SURGICAL HISTORY: Past Surgical History:  Procedure Laterality Date   CARDIOVERSION N/A 11/26/2020   Procedure: CARDIOVERSION;  Surgeon: Lamar Blinks, MD;  Location: [facility] ORS;  Service: Cardiovascular;  Laterality: N/A;   COLONOSCOPY WITH PROPOFOL  COLONOSCOPY WITH PROPOFOL N/A 08/16/2018   Procedure: COLONOSCOPY WITH PROPOFOL;  Surgeon: Christena Deem, MD;  Location: [facility] ENDOSCOPY;  Service: Endoscopy;  Laterality: N/A;   COLONOSCOPY WITH PROPOFOL N/A 12/07/2021   Procedure: COLONOSCOPY WITH PROPOFOL;  Surgeon: Regis Bill, MD;  Location: [facility] ENDOSCOPY;  Service: Endoscopy;  Laterality: N/A;   ESOPHAGOGASTRODUODENOSCOPY (EGD) WITH PROPOFOL N/A 12/07/2021   Procedure:  ESOPHAGOGASTRODUODENOSCOPY (EGD) WITH PROPOFOL;  Surgeon: Regis Bill, MD;  Location: [facility] ENDOSCOPY;  Service: Endoscopy;  Laterality: N/A;   insertion of radiation seeds     TEE WITHOUT CARDIOVERSION N/A 11/26/2020   Procedure: TRANSESOPHAGEAL ECHOCARDIOGRAM (TEE);  Surgeon: Lamar Blinks, MD;  Location: [facility] ORS;  Service: Cardiovascular;  Laterality: N/A;    SOCIAL HISTORY: Social History   Socioeconomic History   Marital status: Married    Spouse name: Not on file   Number of children: Not on file   Years of education: Not on file   Highest education level: Not on file  Occupational History   Not on file  Tobacco Use   Smoking status: Former    Current packs/day: 0.00    Average packs/day: 1.5 packs/day for 30.0 years (45.0 ttl pk-yrs)    Types: Cigarettes    Start date: 01/31/1969    Quit date: 02/01/1999    Years since quitting: 23.7   Smokeless tobacco: Never  Vaping Use   Vaping status: Never Used  Substance and Sexual Activity   Alcohol use: Not Currently    Comment: 15 years ago   Drug use: Never   Sexual activity: Not on file  Other Topics Concern   Not on file  Social History Narrative   Not on file   Social Determinants of Health   Financial Resource Strain: Low Risk  (02/21/2022)   Received from [facility], [facility]   Overall Financial Resource Strain (CARDIA)    Difficulty of Paying Living Expenses: Not hard at all  Food Insecurity: No Food Insecurity (02/21/2022)   Received from [facility], [facility]   Hunger Vital Sign    Worried About Running Out of Food in the Last Year: Never true    Ran Out of Food in the Last Year: Never true  Transportation Needs: No Transportation Needs (02/21/2022)   Received from [facility], [facility]   [facility] - Transportation    In the past 12 months, has lack of transportation kept you from  medical appointments or from getting medications?: No    Lack of Transportation (Non-Medical): No  Physical Activity: Not on file  Stress: Not on file  Social Connections: Not on file  Intimate Partner Violence: Not on file    FAMILY HISTORY: Family History  Problem Relation Age of Onset   Diabetes Mother    Hypertension Father     ALLERGIES:  is allergic to penicillins and egg-derived products.  MEDICATIONS:  Current Outpatient Medications  Medication Sig Dispense Refill   albuterol (PROVENTIL HFA;VENTOLIN HFA) 108 (90 Base) MCG/ACT inhaler Inhale 2 puffs into the lungs every 6 (six) hours as needed for wheezing or shortness of breath. 1 Inhaler 2   amLODipine (NORVASC) 5 MG tablet Take 5 mg by mouth daily.     apixaban (ELIQUIS) 5 MG TABS tablet Take 1 tablet (5 mg total) by mouth 2 (two) times daily. 60 tablet 6   aspirin EC 81 MG tablet Take 81 mg by mouth daily.  atorvastatin (LIPITOR) 40 MG tablet Take 40 mg by mouth daily.     Fluticasone-Salmeterol (ADVAIR) 250-50 MCG/DOSE AEPB INHALE 1 PUFF INTO THE LUNGS EVERY 12 HOURS     furosemide (LASIX) 20 MG tablet TAKE 1 TABLET(20 MG) BY MOUTH EVERY DAY     hydrALAZINE (APRESOLINE) 50 MG tablet Take 50 mg by mouth 3 (three) times daily.     Iron-Vitamin C 65-125 MG TABS Take 1 tablet by mouth daily. 30 tablet 3   losartan (COZAAR) 100 MG tablet Take 100 mg by mouth daily.     metFORMIN (GLUCOPHAGE) 850 MG tablet Take 850 mg by mouth 2 (two) times daily.     metoprolol tartrate (LOPRESSOR) 100 MG tablet Take 50 mg by mouth 2 (two) times daily.     sertraline (ZOLOFT) 100 MG tablet Take 100 mg by mouth daily.     sildenafil (REVATIO) 20 MG tablet Take 100 mg by mouth as needed.     SYMBICORT 80-4.5 MCG/ACT inhaler Inhale 2 puffs into the lungs 2 (two) times daily.     tadalafil (CIALIS) 20 MG tablet Take 20 mg by mouth as needed.     traZODone (DESYREL) 100 MG tablet Take 100 mg by mouth at bedtime as needed for sleep.     No  current facility-administered medications for this visit.   Facility-Administered Medications Ordered in Other Visits  Medication Dose Route Frequency Provider Last Rate Last Admin   0.9 %  sodium chloride infusion   Intravenous Continuous Rickard Patience, MD   Stopped at 10/20/22 1522   alteplase (CATHFLO ACTIVASE) injection 2 mg  2 mg Intracatheter Once PRN Rickard Patience, MD       heparin lock flush 100 unit/mL  500 Units Intracatheter Once PRN Rickard Patience, MD       heparin lock flush 100 unit/mL  250 Units Intracatheter Once PRN Rickard Patience, MD       sodium chloride flush (NS) 0.9 % injection 10 mL  10 mL Intracatheter Once PRN Rickard Patience, MD       sodium chloride flush (NS) 0.9 % injection 3 mL  3 mL Intracatheter Once PRN Rickard Patience, MD        Review of Systems  Constitutional:  Negative for appetite change, chills, fever and unexpected weight change.  HENT:   Negative for hearing loss and voice change.   Eyes:  Negative for eye problems and icterus.  Respiratory:  Negative for chest tightness, cough and shortness of breath.   Cardiovascular:  Negative for chest pain and leg swelling.  Gastrointestinal:  Negative for abdominal distention and abdominal pain.  Endocrine: Negative for hot flashes.  Genitourinary:  Negative for difficulty urinating, dysuria and frequency.   Musculoskeletal:  Negative for arthralgias.  Skin:  Negative for itching and rash.  Neurological:  Negative for light-headedness and numbness.  Hematological:  Negative for adenopathy. Does not bruise/bleed easily.  Psychiatric/Behavioral:  Negative for confusion.    PHYSICAL EXAMINATION: ECOG PERFORMANCE STATUS: 1 - Symptomatic but completely ambulatory Vitals:   10/20/22 1427 10/20/22 1430  BP: (!) 140/57 (!) 137/53  Pulse: 71   Resp: 18   Temp: 97.6 F (36.4 C)    Filed Weights   10/20/22 1427  Weight: 261 lb 9.6 oz (118.7 kg)    Physical Exam Constitutional:      General: He is not in acute distress.    Appearance: He  is obese.  HENT:     Head: Normocephalic and atraumatic.  Eyes:     General: No scleral icterus. Cardiovascular:     Rate and Rhythm: Normal rate.  Pulmonary:     Effort: Pulmonary effort is normal. No respiratory distress.     Breath sounds: No wheezing.  Abdominal:     General: Bowel sounds are normal. There is no distension.     Palpations: Abdomen is soft.  Musculoskeletal:        General: No deformity. Normal range of motion.     Cervical back: Normal range of motion and neck supple.  Skin:    General: Skin is warm and dry.     Findings: No erythema or rash.  Neurological:     Mental Status: He is alert and oriented to person, place, and time. Mental status is at baseline.     Cranial Nerves: No cranial nerve deficit.  Psychiatric:        Mood and Affect: Mood normal.     LABORATORY DATA:  I have reviewed the data as listed    Latest Ref Rng & Units 10/18/2022    8:09 AM 06/15/2022   11:48 AM 03/10/2022   10:22 AM  CBC  WBC 4.0 - 10.5 K/uL 9.8  8.0  9.8   Hemoglobin 13.0 - 17.0 g/dL 16.1  09.6  04.5   Hematocrit 39.0 - 52.0 % 36.3  35.9  35.8   Platelets 150 - 400 K/uL 259  216  231       Latest Ref Rng & Units 01/31/2018    4:28 PM  CMP  Glucose 70 - 99 mg/dL 409   BUN 8 - 23 mg/dL 17   Creatinine 8.11 - 1.24 mg/dL 9.14   Sodium 782 - 956 mmol/L 135   Potassium 3.5 - 5.1 mmol/L 3.9   Chloride 98 - 111 mmol/L 99   CO2 22 - 32 mmol/L 27   Calcium 8.9 - 10.3 mg/dL 8.9       RADIOGRAPHIC STUDIES: I have personally reviewed the radiological images as listed and agreed with the findings in the report. VAS [place] LOWER EXTREMITY VENOUS REFLUX  Result Date: 08/05/2022  Lower Venous Reflux Study Patient Name:  [name]  Date of Exam:   08/04/2022 Medical Rec #: [id number]         Accession #:    [id number] Date of Birth: 09-08-1945        Patient Gender: M Patient Age:   6 years Exam Location:  [facility] Procedure:      VAS [place] LOWER EXTREMITY  VENOUS REFLUX Referring Phys: Levora Dredge --------------------------------------------------------------------------------  Indications: Swelling, Edema, Pain, and ulceration.  Performing Technologist: Hardie Lora RVT  Examination Guidelines: A complete evaluation includes B-mode imaging, spectral Doppler, color Doppler, and power Doppler as needed of all accessible portions of each vessel. Bilateral testing is considered an integral part of a complete examination. Limited examinations for reoccurring indications may be performed as noted. The reflux portion of the exam is performed with the patient in reverse Trendelenburg. Significant venous reflux is defined as >500 ms in the superficial venous system, and >1 second in the deep venous system.  Venous Reflux Times +--------------+---------+------+-----------+------------+--------+ RIGHT         Reflux NoRefluxReflux TimeDiameter cmsComments                         Yes                                  +--------------+---------+------+-----------+------------+--------+  CFV           no                                             +--------------+---------+------+-----------+------------+--------+ FV prox       no                                             +--------------+---------+------+-----------+------------+--------+ FV mid        no                                             +--------------+---------+------+-----------+------------+--------+ FV dist       no                                             +--------------+---------+------+-----------+------------+--------+ Popliteal     no                                             +--------------+---------+------+-----------+------------+--------+ GSV at [facility]    no                            0.42             +--------------+---------+------+-----------+------------+--------+ GSV prox thighno                            0.41              +--------------+---------+------+-----------+------------+--------+ GSV mid thigh no                            0.29             +--------------+---------+------+-----------+------------+--------+ GSV dist thighno                            0.25             +--------------+---------+------+-----------+------------+--------+ GSV at knee   no                            0.22             +--------------+---------+------+-----------+------------+--------+ GSV prox calf no                            0.24             +--------------+---------+------+-----------+------------+--------+ SSV Pop Fossa no                            0.19             +--------------+---------+------+-----------+------------+--------+ SSV prox calf no  0.19             +--------------+---------+------+-----------+------------+--------+ SSV mid calf  no                            0.24             +--------------+---------+------+-----------+------------+--------+  +--------------+---------+------+-----------+------------+--------+ LEFT          Reflux NoRefluxReflux TimeDiameter cmsComments                         Yes                                  +--------------+---------+------+-----------+------------+--------+ CFV           no                                             +--------------+---------+------+-----------+------------+--------+ FV prox       no                                             +--------------+---------+------+-----------+------------+--------+ FV mid        no                                             +--------------+---------+------+-----------+------------+--------+ FV dist       no                                             +--------------+---------+------+-----------+------------+--------+ Popliteal     no                                              +--------------+---------+------+-----------+------------+--------+ GSV at [facility]    no                            0.57             +--------------+---------+------+-----------+------------+--------+ GSV prox thighno                            0.41             +--------------+---------+------+-----------+------------+--------+ GSV mid thigh no                            0.32             +--------------+---------+------+-----------+------------+--------+ GSV dist thighno                            0.43             +--------------+---------+------+-----------+------------+--------+ GSV at knee   no  0.40             +--------------+---------+------+-----------+------------+--------+ GSV prox calf no                            0.29             +--------------+---------+------+-----------+------------+--------+ SSV Pop Fossa no                            0.30             +--------------+---------+------+-----------+------------+--------+ SSV prox calf no                            0.28             +--------------+---------+------+-----------+------------+--------+ SSV mid calf  no                            0.25             +--------------+---------+------+-----------+------------+--------+   Summary: Bilateral: - No evidence of deep vein thrombosis seen in the lower extremities, bilaterally, from the common femoral through the popliteal veins. - No evidence of superficial venous thrombosis in the lower extremities, bilaterally.  Right: - There is no evidence of venous reflux seen in the right lower extremity. - No evidence of superficial venous reflux seen in the right greater saphenous vein. - No evidence of superficial venous reflux seen in the right short saphenous vein.  Left: - There is no evidence of venous reflux seen in the left lower extremity. - No evidence of superficial venous reflux seen in the left greater saphenous  vein. - No evidence of superficial venous reflux seen in the left short saphenous vein.  *See table(s) above for measurements and observations. Electronically signed by Levora Dredge MD on 08/05/2022 at 12:15:14 PM.    Final

## 2022-10-20 NOTE — Assessment & Plan Note (Signed)
Avoid nephrotoxins. There might be a component of anemia secondary to chronic kidney disease.

## 2022-10-20 NOTE — Patient Instructions (Signed)
Iron Sucrose Injection What is this medication? IRON SUCROSE (EYE ern SOO krose) treats low levels of iron (iron deficiency anemia) in people with kidney disease. Iron is a mineral that plays an important role in making red blood cells, which carry oxygen from your lungs to the rest of your body. This medicine may be used for other purposes; ask your health care provider or pharmacist if you have questions. COMMON BRAND NAME(S): Venofer What should I tell my care team before I take this medication? They need to know if you have any of these conditions: Anemia not caused by low iron levels Heart disease High levels of iron in the blood Kidney disease Liver disease An unusual or allergic reaction to iron, other medications, foods, dyes, or preservatives Pregnant or trying to get pregnant Breastfeeding How should I use this medication? This medication is for infusion into a vein. It is given in a hospital or clinic setting. Talk to your care team about the use of this medication in children. While this medication may be prescribed for children as young as 2 years for selected conditions, precautions do apply. Overdosage: If you think you have taken too much of this medicine contact a poison control center or emergency room at once. NOTE: This medicine is only for you. Do not share this medicine with others. What if I miss a dose? Keep appointments for follow-up doses. It is important not to miss your dose. Call your care team if you are unable to keep an appointment. What may interact with this medication? Do not take this medication with any of the following: Deferoxamine Dimercaprol Other iron products This medication may also interact with the following: Chloramphenicol Deferasirox This list may not describe all possible interactions. Give your health care provider a list of all the medicines, herbs, non-prescription drugs, or dietary supplements you use. Also tell them if you smoke,  drink alcohol, or use illegal drugs. Some items may interact with your medicine. What should I watch for while using this medication? Visit your care team regularly. Tell your care team if your symptoms do not start to get better or if they get worse. You may need blood work done while you are taking this medication. You may need to follow a special diet. Talk to your care team. Foods that contain iron include: whole grains/cereals, dried fruits, beans, or peas, leafy green vegetables, and organ meats (liver, kidney). What side effects may I notice from receiving this medication? Side effects that you should report to your care team as soon as possible: Allergic reactions--skin rash, itching, hives, swelling of the face, lips, tongue, or throat Low blood pressure--dizziness, feeling faint or lightheaded, blurry vision Shortness of breath Side effects that usually do not require medical attention (report to your care team if they continue or are bothersome): Flushing Headache Joint pain Muscle pain Nausea Pain, redness, or irritation at injection site This list may not describe all possible side effects. Call your doctor for medical advice about side effects. You may report side effects to FDA at 1-800-FDA-1088. Where should I keep my medication? This medication is given in a hospital or clinic. It will not be stored at home. NOTE: This sheet is a summary. It may not cover all possible information. If you have questions about this medicine, talk to your doctor, pharmacist, or health care provider.  2024 Elsevier/Gold Standard (2022-06-03 00:00:00)

## 2022-10-20 NOTE — Assessment & Plan Note (Addendum)
Labs are reviewed and discussed with patient. Lab Results  Component Value Date   HGB 11.0 (L) 10/18/2022   TIBC 329 10/18/2022   IRONPCTSAT 13 (L) 10/18/2022   FERRITIN 77 10/18/2022    Recommend IV Venofer 200 mg weekly x 3 to further improve iron store.   After that continue Vitron-C 1 tablet daily as maintenance.

## 2022-10-27 ENCOUNTER — Inpatient Hospital Stay: Payer: Medicare Other

## 2022-10-27 VITALS — BP 127/44 | HR 72 | Temp 97.3°F | Resp 18

## 2022-10-27 DIAGNOSIS — D509 Iron deficiency anemia, unspecified: Secondary | ICD-10-CM | POA: Diagnosis not present

## 2022-10-27 DIAGNOSIS — D500 Iron deficiency anemia secondary to blood loss (chronic): Secondary | ICD-10-CM

## 2022-10-27 MED ORDER — SODIUM CHLORIDE 0.9 % IV SOLN
200.0000 mg | Freq: Once | INTRAVENOUS | Status: AC
  Administered 2022-10-27: 200 mg via INTRAVENOUS
  Filled 2022-10-27: qty 200

## 2022-10-27 MED ORDER — SODIUM CHLORIDE 0.9 % IV SOLN
Freq: Once | INTRAVENOUS | Status: AC
  Filled 2022-10-27: qty 250

## 2022-11-03 ENCOUNTER — Inpatient Hospital Stay: Payer: Medicare Other

## 2022-11-03 VITALS — BP 148/62 | HR 72 | Temp 97.9°F

## 2022-11-03 DIAGNOSIS — D500 Iron deficiency anemia secondary to blood loss (chronic): Secondary | ICD-10-CM

## 2022-11-03 DIAGNOSIS — D509 Iron deficiency anemia, unspecified: Secondary | ICD-10-CM | POA: Diagnosis not present

## 2022-11-03 MED ORDER — IRON SUCROSE 20 MG/ML IV SOLN
200.0000 mg | Freq: Once | INTRAVENOUS | Status: AC
Start: 2022-11-03 — End: 2022-11-03
  Administered 2022-11-03: 200 mg via INTRAVENOUS

## 2022-11-03 NOTE — Patient Instructions (Signed)
Iron Sucrose Injection What is this medication? IRON SUCROSE (EYE ern SOO krose) treats low levels of iron (iron deficiency anemia) in people with kidney disease. Iron is a mineral that plays an important role in making red blood cells, which carry oxygen from your lungs to the rest of your body. This medicine may be used for other purposes; ask your health care provider or pharmacist if you have questions. COMMON BRAND NAME(S): Venofer What should I tell my care team before I take this medication? They need to know if you have any of these conditions: Anemia not caused by low iron levels Heart disease High levels of iron in the blood Kidney disease Liver disease An unusual or allergic reaction to iron, other medications, foods, dyes, or preservatives Pregnant or trying to get pregnant Breastfeeding How should I use this medication? This medication is for infusion into a vein. It is given in a hospital or clinic setting. Talk to your care team about the use of this medication in children. While this medication may be prescribed for children as young as 2 years for selected conditions, precautions do apply. Overdosage: If you think you have taken too much of this medicine contact a poison control center or emergency room at once. NOTE: This medicine is only for you. Do not share this medicine with others. What if I miss a dose? Keep appointments for follow-up doses. It is important not to miss your dose. Call your care team if you are unable to keep an appointment. What may interact with this medication? Do not take this medication with any of the following: Deferoxamine Dimercaprol Other iron products This medication may also interact with the following: Chloramphenicol Deferasirox This list may not describe all possible interactions. Give your health care provider a list of all the medicines, herbs, non-prescription drugs, or dietary supplements you use. Also tell them if you smoke,  drink alcohol, or use illegal drugs. Some items may interact with your medicine. What should I watch for while using this medication? Visit your care team regularly. Tell your care team if your symptoms do not start to get better or if they get worse. You may need blood work done while you are taking this medication. You may need to follow a special diet. Talk to your care team. Foods that contain iron include: whole grains/cereals, dried fruits, beans, or peas, leafy green vegetables, and organ meats (liver, kidney). What side effects may I notice from receiving this medication? Side effects that you should report to your care team as soon as possible: Allergic reactions--skin rash, itching, hives, swelling of the face, lips, tongue, or throat Low blood pressure--dizziness, feeling faint or lightheaded, blurry vision Shortness of breath Side effects that usually do not require medical attention (report to your care team if they continue or are bothersome): Flushing Headache Joint pain Muscle pain Nausea Pain, redness, or irritation at injection site This list may not describe all possible side effects. Call your doctor for medical advice about side effects. You may report side effects to FDA at 1-800-FDA-1088. Where should I keep my medication? This medication is given in a hospital or clinic. It will not be stored at home. NOTE: This sheet is a summary. It may not cover all possible information. If you have questions about this medicine, talk to your doctor, pharmacist, or health care provider.  2024 Elsevier/Gold Standard (2022-06-03 00:00:00)

## 2023-01-09 ENCOUNTER — Telehealth (INDEPENDENT_AMBULATORY_CARE_PROVIDER_SITE_OTHER): Payer: Self-pay | Admitting: Nurse Practitioner

## 2023-01-09 NOTE — Telephone Encounter (Signed)
After 4 weeks of daily exercise, elevation, and compression patient lymphedema and hyperpigmentation has not improved. Recommendation of pump. Measurements: Left ankle  41.5 cm (10/2);  41.9 cm(10/31) Right ankle  41.6 cm(10/2)  41.8 cm(10/31) Left calf      53.6 cm(10/2);  53.8 cm(10/31) Right calf         50.2 cm(10/2);  50.3 cm(10/31)

## 2023-02-08 NOTE — Progress Notes (Signed)
MRN : [id number]  [name] is a 78 y.o. (1945/01/17) male who presents with chief complaint of legs swell.  History of Present Illness:  The patient returns to the office for followup evaluation regarding leg swelling.  The swelling has persisted and the pain associated with swelling continues. There have not been any interval development of a ulcerations or wounds.  Since the previous visit the patient has been wearing graduated compression stockings and has noted little if any improvement in the lymphedema. The patient has been using compression routinely morning until night.  The patient also states elevation during the day and exercise is being done too.  He has just received his lymphedema pump but has not been able to use it very well.  Current Meds  Medication Sig   albuterol (PROVENTIL HFA;VENTOLIN HFA) 108 (90 Base) MCG/ACT inhaler Inhale 2 puffs into the lungs every 6 (six) hours as needed for wheezing or shortness of breath.   amLODipine (NORVASC) 5 MG tablet Take 5 mg by mouth daily.   apixaban (ELIQUIS) 5 MG TABS tablet Take 1 tablet (5 mg total) by mouth 2 (two) times daily.   aspirin EC 81 MG tablet Take 81 mg by mouth daily.   atorvastatin (LIPITOR) 40 MG tablet Take 40 mg by mouth daily.   Fluticasone-Salmeterol (ADVAIR) 250-50 MCG/DOSE AEPB INHALE 1 PUFF INTO THE LUNGS EVERY 12 HOURS   furosemide (LASIX) 20 MG tablet TAKE 1 TABLET(20 MG) BY MOUTH EVERY DAY   hydrALAZINE (APRESOLINE) 50 MG tablet Take 50 mg by mouth 3 (three) times daily.   Iron-Vitamin C 65-125 MG TABS Take 1 tablet by mouth daily.   losartan (COZAAR) 100 MG tablet Take 100 mg by mouth daily.   metFORMIN (GLUCOPHAGE) 850 MG tablet Take 850 mg by mouth 2 (two) times daily.   metoprolol tartrate (LOPRESSOR) 100 MG tablet Take 50 mg by mouth 2 (two) times daily.   sertraline (ZOLOFT) 100 MG tablet Take 100 mg by mouth daily.   sildenafil (REVATIO) 20 MG tablet Take  100 mg by mouth as needed.   SYMBICORT 80-4.5 MCG/ACT inhaler Inhale 2 puffs into the lungs 2 (two) times daily.   tadalafil (CIALIS) 20 MG tablet Take 20 mg by mouth as needed.   traZODone (DESYREL) 100 MG tablet Take 100 mg by mouth at bedtime as needed for sleep.    Past Medical History:  Diagnosis Date   Cancer [facility])    COPD (chronic obstructive pulmonary disease) (HCC)    Diabetes mellitus without complication (HCC)    Hyperlipidemia    Hypertension    Left thyroid nodule    Prostate CA (HCC)    Renal insufficiency     Past Surgical History:  Procedure Laterality Date   CARDIOVERSION N/A 11/26/2020   Procedure: CARDIOVERSION;  Surgeon: Lamar Blinks, MD;  Location: [facility] ORS;  Service: Cardiovascular;  Laterality: N/A;   COLONOSCOPY WITH PROPOFOL     COLONOSCOPY WITH PROPOFOL N/A 08/16/2018   Procedure: COLONOSCOPY WITH PROPOFOL;  Surgeon: Christena Deem, MD;  Location: [facility] ENDOSCOPY;  Service: Endoscopy;  Laterality: N/A;   COLONOSCOPY WITH PROPOFOL N/A 12/07/2021   Procedure: COLONOSCOPY WITH PROPOFOL;  Surgeon: Regis Bill, MD;  Location: [facility] ENDOSCOPY;  Service: Endoscopy;  Laterality: N/A;   ESOPHAGOGASTRODUODENOSCOPY (EGD) WITH PROPOFOL N/A 12/07/2021  Procedure: ESOPHAGOGASTRODUODENOSCOPY (EGD) WITH PROPOFOL;  Surgeon: Regis Bill, MD;  Location: [facility] ENDOSCOPY;  Service: Endoscopy;  Laterality: N/A;   insertion of radiation seeds     TEE WITHOUT CARDIOVERSION N/A 11/26/2020   Procedure: TRANSESOPHAGEAL ECHOCARDIOGRAM (TEE);  Surgeon: Lamar Blinks, MD;  Location: [facility] ORS;  Service: Cardiovascular;  Laterality: N/A;    Social History Social History   Tobacco Use   Smoking status: Former    Current packs/day: 0.00    Average packs/day: 1.5 packs/day for 30.0 years (45.0 ttl pk-yrs)    Types: Cigarettes    Start date: 01/31/1969    Quit date: 02/01/1999    Years since quitting: 24.0   Smokeless tobacco: Never  Vaping Use   Vaping  status: Never Used  Substance Use Topics   Alcohol use: Not Currently    Comment: 15 years ago   Drug use: Never    Family History Family History  Problem Relation Age of Onset   Diabetes Mother    Hypertension Father     Allergies  Allergen Reactions   Penicillins Swelling   Egg-Derived Products Other (See Comments)     REVIEW OF SYSTEMS (Negative unless checked)  Constitutional: [] Weight loss  [] Fever  [] Chills Cardiac: [] Chest pain   [] Chest pressure   [] Palpitations   [] Shortness of breath when laying flat   [] Shortness of breath with exertion. Vascular:  [] Pain in legs with walking   [x] Pain in legs with standing  [] History of DVT   [] Phlebitis   [x] Swelling in legs   [] Varicose veins   [] Non-healing ulcers Pulmonary:   [] Uses home oxygen   [] Productive cough   [] Hemoptysis   [] Wheeze  [] COPD   [] Asthma Neurologic:  [] Dizziness   [] Seizures   [] History of stroke   [] History of TIA  [] Aphasia   [] Vissual changes   [] Weakness or numbness in arm   [] Weakness or numbness in leg Musculoskeletal:   [] Joint swelling   [] Joint pain   [] Low back pain Hematologic:  [] Easy bruising  [] Easy bleeding   [] Hypercoagulable state   [] Anemic Gastrointestinal:  [] Diarrhea   [] Vomiting  [] Gastroesophageal reflux/heartburn   [] Difficulty swallowing. Genitourinary:  [] Chronic kidney disease   [] Difficult urination  [] Frequent urination   [] Blood in urine Skin:  [] Rashes   [] Ulcers  Psychological:  [] History of anxiety   []  History of major depression.  Physical Examination  Vitals:   02/09/23 0922  BP: (!) 166/76  Pulse: 70  Resp: 16  Weight: 261 lb 3.2 oz (118.5 kg)   Body mass index is 42.16 kg/m. Gen: WD/WN, NAD Head: [place]/AT, No temporalis wasting.  Ear/Nose/Throat: Hearing grossly intact, nares w/o erythema or drainage, pinna without lesions Eyes: PER, EOMI, sclera nonicteric.  Neck: Supple, no gross masses.  No JVD.  Pulmonary:  Good air movement, no audible wheezing, no use of  accessory muscles.  Cardiac: RRR, precordium not hyperdynamic. Vascular:  scattered varicosities present bilaterally.  Mild venous stasis changes to the legs bilaterally.  3-4+ soft pitting edema, CEAP [id number]  Vessel Right Left  Radial Palpable Palpable  Gastrointestinal: soft, non-distended. No guarding/no peritoneal signs.  Musculoskeletal: M/S 5/5 throughout.  No deformity.  Neurologic: CN 2-12 intact. Pain and light touch intact in extremities.  Symmetrical.  Speech is fluent. Motor exam as listed above. Psychiatric: Judgment intact, Mood & affect appropriate for pt's clinical situation. Dermatologic: Venous rashes no ulcers noted.  No changes consistent with cellulitis. Lymph : Lichenification present with palpable dermatosis  CBC Lab Results  Component Value Date   WBC 9.8 10/18/2022   HGB 11.0 (L) 10/18/2022   HCT 36.3 (L) 10/18/2022   MCV 87.9 10/18/2022   PLT 259 10/18/2022    BMET    Component Value Date/Time   NA 135 01/31/2018 1628   K 3.9 01/31/2018 1628   CL 99 01/31/2018 1628   CO2 27 01/31/2018 1628   GLUCOSE 171 (H) 01/31/2018 1628   BUN 17 01/31/2018 1628   CREATININE 1.51 (H) 01/31/2018 1628   CALCIUM 8.9 01/31/2018 1628   GFRNONAA 45 (L) 01/31/2018 1628   GFRAA 53 (L) 01/31/2018 1628   CrCl cannot be calculated (Patient's most recent lab result is older than the maximum 21 days allowed.).  COAG Lab Results  Component Value Date   INR 1.2 11/26/2020    Radiology No results found.   Assessment/Plan 1. Lymphedema (Primary) Despite the patient using compression he has leaking from his lower extremities with small areas of wounds.  We will place the patient into Unna boots today.  These will be changed weekly drainage permitting.  Because he has just received his lymphedema pump I have advised that he can start utilizing this as well and I think this will also significantly help his overall edema.  Will plan on having him return in 4 weeks to  evaluate his overall swelling.  2. Mixed hyperlipidemia Continue statin as ordered and reviewed, no changes at this time  3. Chronic kidney disease (CKD), active medical management without dialysis, stage 4 (severe) (HCC) This can certainly exacerbate some of his lower extremity edema  4. Essential hypertension Continue antihypertensive medications as already ordered, these medications have been reviewed and there are no changes at this time.    Georgiana Spinner, NP  02/10/2023 12:48 AM

## 2023-02-09 ENCOUNTER — Ambulatory Visit (INDEPENDENT_AMBULATORY_CARE_PROVIDER_SITE_OTHER): Payer: Medicare Other | Admitting: Nurse Practitioner

## 2023-02-09 ENCOUNTER — Encounter (INDEPENDENT_AMBULATORY_CARE_PROVIDER_SITE_OTHER): Payer: Self-pay | Admitting: Vascular Surgery

## 2023-02-09 VITALS — BP 166/76 | HR 70 | Resp 16 | Wt 261.2 lb

## 2023-02-09 DIAGNOSIS — N184 Chronic kidney disease, stage 4 (severe): Secondary | ICD-10-CM

## 2023-02-09 DIAGNOSIS — I10 Essential (primary) hypertension: Secondary | ICD-10-CM

## 2023-02-09 DIAGNOSIS — E782 Mixed hyperlipidemia: Secondary | ICD-10-CM | POA: Diagnosis not present

## 2023-02-09 DIAGNOSIS — I890 Lymphedema, not elsewhere classified: Secondary | ICD-10-CM | POA: Diagnosis not present

## 2023-02-09 DIAGNOSIS — J449 Chronic obstructive pulmonary disease, unspecified: Secondary | ICD-10-CM

## 2023-02-10 ENCOUNTER — Encounter (INDEPENDENT_AMBULATORY_CARE_PROVIDER_SITE_OTHER): Payer: Self-pay | Admitting: Nurse Practitioner

## 2023-02-16 ENCOUNTER — Encounter (INDEPENDENT_AMBULATORY_CARE_PROVIDER_SITE_OTHER): Payer: Self-pay | Admitting: Vascular Surgery

## 2023-02-16 ENCOUNTER — Encounter (INDEPENDENT_AMBULATORY_CARE_PROVIDER_SITE_OTHER): Payer: Self-pay

## 2023-02-16 ENCOUNTER — Ambulatory Visit (INDEPENDENT_AMBULATORY_CARE_PROVIDER_SITE_OTHER): Payer: Medicare Other | Admitting: Nurse Practitioner

## 2023-02-16 VITALS — BP 177/74 | HR 75 | Resp 16

## 2023-02-16 DIAGNOSIS — I890 Lymphedema, not elsewhere classified: Secondary | ICD-10-CM | POA: Diagnosis not present

## 2023-02-16 NOTE — Progress Notes (Signed)
 History of Present Illness  There is no documented history at this time  Assessments & Plan   There are no diagnoses linked to this encounter.    Additional instructions  Subjective:  Patient presents with venous ulcer of the Bilateral lower extremity.    Procedure:  3 layer unna wrap was placed Bilateral lower extremity.   Plan:   Follow up in one week.

## 2023-02-20 ENCOUNTER — Inpatient Hospital Stay: Payer: Medicare Other | Attending: Oncology

## 2023-02-20 DIAGNOSIS — D509 Iron deficiency anemia, unspecified: Secondary | ICD-10-CM | POA: Diagnosis present

## 2023-02-20 DIAGNOSIS — R5382 Chronic fatigue, unspecified: Secondary | ICD-10-CM | POA: Insufficient documentation

## 2023-02-20 DIAGNOSIS — Z79899 Other long term (current) drug therapy: Secondary | ICD-10-CM | POA: Diagnosis not present

## 2023-02-20 DIAGNOSIS — Z87891 Personal history of nicotine dependence: Secondary | ICD-10-CM | POA: Insufficient documentation

## 2023-02-20 DIAGNOSIS — D500 Iron deficiency anemia secondary to blood loss (chronic): Secondary | ICD-10-CM

## 2023-02-20 LAB — CBC WITH DIFFERENTIAL (CANCER CENTER ONLY)
Abs Immature Granulocytes: 0.06 10*3/uL (ref 0.00–0.07)
Basophils Absolute: 0.1 10*3/uL (ref 0.0–0.1)
Basophils Relative: 1 %
Eosinophils Absolute: 0.3 10*3/uL (ref 0.0–0.5)
Eosinophils Relative: 3 %
HCT: 36.7 % — ABNORMAL LOW (ref 39.0–52.0)
Hemoglobin: 11.4 g/dL — ABNORMAL LOW (ref 13.0–17.0)
Immature Granulocytes: 1 %
Lymphocytes Relative: 14 %
Lymphs Abs: 1.6 10*3/uL (ref 0.7–4.0)
MCH: 27.2 pg (ref 26.0–34.0)
MCHC: 31.1 g/dL (ref 30.0–36.0)
MCV: 87.6 fL (ref 80.0–100.0)
Monocytes Absolute: 1.1 10*3/uL — ABNORMAL HIGH (ref 0.1–1.0)
Monocytes Relative: 10 %
Neutro Abs: 7.8 10*3/uL — ABNORMAL HIGH (ref 1.7–7.7)
Neutrophils Relative %: 71 %
Platelet Count: 215 10*3/uL (ref 150–400)
RBC: 4.19 MIL/uL — ABNORMAL LOW (ref 4.22–5.81)
RDW: 14 % (ref 11.5–15.5)
WBC Count: 10.9 10*3/uL — ABNORMAL HIGH (ref 4.0–10.5)
nRBC: 0 % (ref 0.0–0.2)

## 2023-02-20 LAB — IRON AND TIBC
Iron: 59 ug/dL (ref 45–182)
Saturation Ratios: 19 % (ref 17.9–39.5)
TIBC: 311 ug/dL (ref 250–450)
UIBC: 252 ug/dL

## 2023-02-20 LAB — FERRITIN: Ferritin: 100 ng/mL (ref 24–336)

## 2023-02-23 ENCOUNTER — Inpatient Hospital Stay (HOSPITAL_BASED_OUTPATIENT_CLINIC_OR_DEPARTMENT_OTHER): Payer: Medicare Other | Admitting: Oncology

## 2023-02-23 ENCOUNTER — Encounter (INDEPENDENT_AMBULATORY_CARE_PROVIDER_SITE_OTHER): Payer: Self-pay | Admitting: Nurse Practitioner

## 2023-02-23 ENCOUNTER — Encounter: Payer: Self-pay | Admitting: Oncology

## 2023-02-23 ENCOUNTER — Ambulatory Visit (INDEPENDENT_AMBULATORY_CARE_PROVIDER_SITE_OTHER): Payer: Medicare Other | Admitting: Nurse Practitioner

## 2023-02-23 ENCOUNTER — Inpatient Hospital Stay: Payer: Medicare Other

## 2023-02-23 VITALS — BP 156/64 | HR 75 | Temp 96.5°F | Resp 18 | Wt 261.4 lb

## 2023-02-23 VITALS — BP 133/70 | HR 66 | Resp 16

## 2023-02-23 DIAGNOSIS — D509 Iron deficiency anemia, unspecified: Secondary | ICD-10-CM | POA: Diagnosis not present

## 2023-02-23 DIAGNOSIS — I890 Lymphedema, not elsewhere classified: Secondary | ICD-10-CM

## 2023-02-23 DIAGNOSIS — D500 Iron deficiency anemia secondary to blood loss (chronic): Secondary | ICD-10-CM

## 2023-02-23 NOTE — Progress Notes (Signed)
Hematology/Oncology Consult note [phone] [phone]         Patient Care Team: Dorothey Baseman, MD as PCP - General (Family Medicine) Rickard Patience, MD as Consulting Physician (Oncology)  REFERRING PROVIDER: Dorothey Baseman, MD   ASSESSMENT & PLAN:   IDA (iron deficiency anemia) Labs are reviewed and discussed with patient. Lab Results  Component Value Date   HGB 11.4 (L) 02/20/2023   TIBC 311 02/20/2023   IRONPCTSAT 19 02/20/2023   FERRITIN 100 02/20/2023    Hemoglobin has slightly improved.  Iron panel has improved.  I will hold off IV Venofer for now.  continue Vitron-C 1 tablet daily as maintenance.     Orders Placed This Encounter  Procedures   CBC with Differential (Cancer Center Only)    Standing Status:   Future    Expected Date:   08/23/2023    Expiration Date:   02/23/2024   Ferritin    Standing Status:   Future    Expected Date:   08/23/2023    Expiration Date:   02/23/2024   Iron and TIBC    Standing Status:   Future    Expected Date:   08/23/2023    Expiration Date:   02/23/2024   Retic Panel    Standing Status:   Future    Expected Date:   08/23/2023    Expiration Date:   02/23/2024   Follow-up in 6 months. All questions were answered. The patient knows to call the clinic with any problems, questions or concerns.  Rickard Patience, MD, PhD [facility] Hematology Oncology 02/23/2023 '  CHIEF COMPLAINTS/REASON FOR VISIT:  Evaluation of iron deficiency anemia  HISTORY OF PRESENTING ILLNESS:   [name] is a  78 y.o.  male with PMH listed below was seen in consultation at the request of  Dorothey Baseman, MD  for evaluation of anemia. 11/23/2021, CBC showed hemoglobin 8.1, MCV 69.7, hematocrit 29.4, Iron panel showed saturation of 5%, TIBC 457, ferritin 28. 12/08/2021, status post EGD and colonoscopy please find use of erosive gastropathy with no stigmata of recent bleeding.  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  No significant intestinal metaplastic, dysplastic or glandular atrophic.  Immunohistochemical stain for H. pylori will be performed.  Results will be issued in an addendum. Patient was accompanied by her daughter today. She reports feeling okay.  Chronic fatigue, unchanged. Patient denies any black stool or bright red blood in the stool.  History of prostate cancer, status post brachytherapy with rising PSA.  He follows up with  urology. 03/30/2022 repeat PMSA-negative for metastatic disease.  2 sites in the prostate consistent with locally recurrent prostate cancer.  Dr. Apolinar Junes recommends hold off treatment at this point in the absence of symptoms.  Urology will follow-up on PSA  INTERVAL HISTORY [name] is a 78 y.o. male who has above history reviewed by me today presents for follow up visit for iron deficiency anemia.  He tolerates IV venofer. Fatigue has improved.  He follows up with urology for prostate cancer surveillance.   MEDICAL HISTORY:  Past Medical History:  Diagnosis Date   Cancer (HCC)    COPD (chronic obstructive pulmonary disease) (HCC)    Diabetes mellitus without complication (HCC)    Hyperlipidemia    Hypertension    Left thyroid nodule    Prostate CA (HCC)    Renal insufficiency     SURGICAL HISTORY: Past Surgical History:  Procedure Laterality Date   CARDIOVERSION N/A  11/26/2020   Procedure: CARDIOVERSION;  Surgeon: Lamar Blinks, MD;  Location: [facility] ORS;  Service: Cardiovascular;  Laterality: N/A;   COLONOSCOPY WITH PROPOFOL     COLONOSCOPY WITH PROPOFOL N/A 08/16/2018   Procedure: COLONOSCOPY WITH PROPOFOL;  Surgeon: Christena Deem, MD;  Location[facility] ENDOSCOPY;  Service: Endoscopy;  Laterality: N/A;   COLONOSCOPY WITH PROPOFOL N/A 12/07/2021   Procedure: COLONOSCOPY WITH PROPOFOL;  Surgeon: Regis Bill, MD;  Location: [facility] ENDOSCOPY;  Service: Endoscopy;  Laterality: N/A;   ESOPHAGOGASTRODUODENOSCOPY (EGD) WITH PROPOFOL  N/A 12/07/2021   Procedure: ESOPHAGOGASTRODUODENOSCOPY (EGD) WITH PROPOFOL;  Surgeon: Regis Bill, MD;  Location: [facility] ENDOSCOPY;  Service: Endoscopy;  Laterality: N/A;   insertion of radiation seeds     TEE WITHOUT CARDIOVERSION N/A 11/26/2020   Procedure: TRANSESOPHAGEAL ECHOCARDIOGRAM (TEE);  Surgeon: Lamar Blinks, MD;  Location: [facility] ORS;  Service: Cardiovascular;  Laterality: N/A;    SOCIAL HISTORY: Social History   Socioeconomic History   Marital status: Married    Spouse name: Not on file   Number of children: Not on file   Years of education: Not on file   Highest education level: Not on file  Occupational History   Not on file  Tobacco Use   Smoking status: Former    Current packs/day: 0.00    Average packs/day: 1.5 packs/day for 30.0 years (45.0 ttl pk-yrs)    Types: Cigarettes    Start date: 01/31/1969    Quit date: 02/01/1999    Years since quitting: 24.0   Smokeless tobacco: Never  Vaping Use   Vaping status: Never Used  Substance and Sexual Activity   Alcohol use: Not Currently    Comment: 15 years ago   Drug use: Never   Sexual activity: Not on file  Other Topics Concern   Not on file  Social History Narrative   Not on file   Social Drivers of Health   Financial Resource Strain: Low Risk  (02/21/2022)   Received from [facility], [facility]   Overall Financial Resource Strain (CARDIA)    Difficulty of Paying Living Expenses: Not hard at all  Food Insecurity: No Food Insecurity (02/21/2022)   Received from [facility], [facility]   Hunger Vital Sign    Worried About Running Out of Food in the Last Year: Never true    Ran Out of Food in the Last Year: Never true  Transportation Needs: No Transportation Needs (02/21/2022)   Received from [facility], [facility]   [facility] - Transportation    In the past 12 months, has lack of  transportation kept you from medical appointments or from getting medications?: No    Lack of Transportation (Non-Medical): No  Physical Activity: Not on file  Stress: Not on file  Social Connections: Not on file  Intimate Partner Violence: Not on file    FAMILY HISTORY: Family History  Problem Relation Age of Onset   Diabetes Mother    Hypertension Father     ALLERGIES:  is allergic to penicillins and egg-derived products.  MEDICATIONS:  Current Outpatient Medications  Medication Sig Dispense Refill   albuterol (PROVENTIL HFA;VENTOLIN HFA) 108 (90 Base) MCG/ACT inhaler Inhale 2 puffs into the lungs every 6 (six) hours as needed for wheezing or shortness of breath. 1 Inhaler 2   amLODipine (NORVASC) 5 MG tablet Take 5 mg by mouth daily.     apixaban (ELIQUIS) 5 MG  TABS tablet Take 1 tablet (5 mg total) by mouth 2 (two) times daily. 60 tablet 6   aspirin EC 81 MG tablet Take 81 mg by mouth daily.     atorvastatin (LIPITOR) 40 MG tablet Take 40 mg by mouth daily.     Fluticasone-Salmeterol (ADVAIR) 250-50 MCG/DOSE AEPB INHALE 1 PUFF INTO THE LUNGS EVERY 12 HOURS     furosemide (LASIX) 20 MG tablet TAKE 1 TABLET(20 MG) BY MOUTH EVERY DAY     hydrALAZINE (APRESOLINE) 50 MG tablet Take 50 mg by mouth 3 (three) times daily.     Iron-Vitamin C 65-125 MG TABS Take 1 tablet by mouth daily. 30 tablet 3   losartan (COZAAR) 100 MG tablet Take 100 mg by mouth daily.     metFORMIN (GLUCOPHAGE) 850 MG tablet Take 850 mg by mouth 2 (two) times daily.     metoprolol tartrate (LOPRESSOR) 100 MG tablet Take 50 mg by mouth 2 (two) times daily.     sertraline (ZOLOFT) 100 MG tablet Take 100 mg by mouth daily.     sildenafil (REVATIO) 20 MG tablet Take 100 mg by mouth as needed.     SYMBICORT 80-4.5 MCG/ACT inhaler Inhale 2 puffs into the lungs 2 (two) times daily.     tadalafil (CIALIS) 20 MG tablet Take 20 mg by mouth as needed.     traZODone (DESYREL) 100 MG tablet Take 100 mg by mouth at bedtime as  needed for sleep.     No current facility-administered medications for this visit.    Review of Systems  Constitutional:  Negative for appetite change, chills, fever and unexpected weight change.  HENT:   Negative for hearing loss and voice change.   Eyes:  Negative for eye problems and icterus.  Respiratory:  Negative for chest tightness, cough and shortness of breath.   Cardiovascular:  Negative for chest pain and leg swelling.  Gastrointestinal:  Negative for abdominal distention and abdominal pain.  Endocrine: Negative for hot flashes.  Genitourinary:  Negative for difficulty urinating, dysuria and frequency.   Musculoskeletal:  Negative for arthralgias.  Skin:  Negative for itching and rash.  Neurological:  Negative for light-headedness and numbness.  Hematological:  Negative for adenopathy. Does not bruise/bleed easily.  Psychiatric/Behavioral:  Negative for confusion.    PHYSICAL EXAMINATION: ECOG PERFORMANCE STATUS: 1 - Symptomatic but completely ambulatory Vitals:   02/23/23 1445  BP: (!) 156/64  Pulse: 75  Resp: 18  Temp: (!) 96.5 F (35.8 C)  SpO2: 96%   Filed Weights   02/23/23 1445  Weight: 261 lb 6.4 oz (118.6 kg)    Physical Exam Constitutional:      General: He is not in acute distress.    Appearance: He is obese.  HENT:     Head: Normocephalic and atraumatic.  Eyes:     General: No scleral icterus. Cardiovascular:     Rate and Rhythm: Normal rate.  Pulmonary:     Effort: Pulmonary effort is normal. No respiratory distress.     Breath sounds: No wheezing.  Abdominal:     General: Bowel sounds are normal. There is no distension.     Palpations: Abdomen is soft.  Musculoskeletal:        General: No deformity. Normal range of motion.     Cervical back: Normal range of motion and neck supple.  Skin:    General: Skin is warm and dry.     Findings: No erythema or rash.  Neurological:  Mental Status: He is alert and oriented to person, place, and  time. Mental status is at baseline.     Cranial Nerves: No cranial nerve deficit.  Psychiatric:        Mood and Affect: Mood normal.     LABORATORY DATA:  I have reviewed the data as listed    Latest Ref Rng & Units 02/20/2023    7:54 AM 10/18/2022    8:09 AM 06/15/2022   11:48 AM  CBC  WBC 4.0 - 10.5 K/uL 10.9  9.8  8.0   Hemoglobin 13.0 - 17.0 g/dL 52.8  41.3  24.4   Hematocrit 39.0 - 52.0 % 36.7  36.3  35.9   Platelets 150 - 400 K/uL 215  259  216       Latest Ref Rng & Units 01/31/2018    4:28 PM  CMP  Glucose 70 - 99 mg/dL 010   BUN 8 - 23 mg/dL 17   Creatinine 2.72 - 1.24 mg/dL 5.36   Sodium 644 - 034 mmol/L 135   Potassium 3.5 - 5.1 mmol/L 3.9   Chloride 98 - 111 mmol/L 99   CO2 22 - 32 mmol/L 27   Calcium 8.9 - 10.3 mg/dL 8.9       RADIOGRAPHIC STUDIES: I have personally reviewed the radiological images as listed and agreed with the findings in the report. No results found.

## 2023-02-23 NOTE — Progress Notes (Signed)
History of Present Illness  There is no documented history at this time  Assessments & Plan   There are no diagnoses linked to this encounter.    Additional instructions  Subjective:  Patient presents with venous ulcer of the Bilateral lower extremity.    Procedure:  3 layer unna wrap was placed Bilateral lower extremity.   Plan:   Follow up in one week.

## 2023-02-23 NOTE — Assessment & Plan Note (Addendum)
Labs are reviewed and discussed with patient. Lab Results  Component Value Date   HGB 11.4 (L) 02/20/2023   TIBC 311 02/20/2023   IRONPCTSAT 19 02/20/2023   FERRITIN 100 02/20/2023    Hemoglobin has slightly improved.  Iron panel has improved.  I will hold off IV Venofer for now.  continue Vitron-C 1 tablet daily as maintenance.

## 2023-02-26 ENCOUNTER — Encounter (INDEPENDENT_AMBULATORY_CARE_PROVIDER_SITE_OTHER): Payer: Self-pay | Admitting: Nurse Practitioner

## 2023-03-02 ENCOUNTER — Encounter (INDEPENDENT_AMBULATORY_CARE_PROVIDER_SITE_OTHER): Payer: Self-pay | Admitting: Vascular Surgery

## 2023-03-02 ENCOUNTER — Ambulatory Visit (INDEPENDENT_AMBULATORY_CARE_PROVIDER_SITE_OTHER): Payer: Medicare Other | Admitting: Nurse Practitioner

## 2023-03-02 ENCOUNTER — Encounter (INDEPENDENT_AMBULATORY_CARE_PROVIDER_SITE_OTHER): Payer: Self-pay

## 2023-03-02 VITALS — BP 168/71 | HR 78 | Resp 18 | Ht 66.0 in | Wt 261.0 lb

## 2023-03-02 DIAGNOSIS — I890 Lymphedema, not elsewhere classified: Secondary | ICD-10-CM

## 2023-03-02 NOTE — Progress Notes (Signed)
 History of Present Illness  There is no documented history at this time  Assessments & Plan   There are no diagnoses linked to this encounter.    Additional instructions  Subjective:  Patient presents with venous ulcer of the Bilateral lower extremity.    Procedure:  3 layer unna wrap was placed Bilateral lower extremity.   Plan:   Follow up in one week.

## 2023-03-03 ENCOUNTER — Other Ambulatory Visit: Payer: Medicare Other

## 2023-03-03 ENCOUNTER — Encounter: Payer: Self-pay | Admitting: Urology

## 2023-03-03 DIAGNOSIS — C61 Malignant neoplasm of prostate: Secondary | ICD-10-CM

## 2023-03-03 DIAGNOSIS — R972 Elevated prostate specific antigen [PSA]: Secondary | ICD-10-CM

## 2023-03-04 LAB — PSA: Prostate Specific Ag, Serum: 10.5 ng/mL — ABNORMAL HIGH (ref 0.0–4.0)

## 2023-03-08 ENCOUNTER — Ambulatory Visit: Payer: Medicare Other | Admitting: Urology

## 2023-03-08 ENCOUNTER — Encounter: Payer: Self-pay | Admitting: Urology

## 2023-03-08 VITALS — BP 163/67 | HR 61 | Ht 66.0 in | Wt 255.4 lb

## 2023-03-08 DIAGNOSIS — C61 Malignant neoplasm of prostate: Secondary | ICD-10-CM

## 2023-03-08 NOTE — Progress Notes (Signed)
 Marcelle Overlie Plume,acting as a scribe for Vanna Scotland, MD.,have documented all relevant documentation on the behalf of Vanna Scotland, MD,as directed by  Vanna Scotland, MD while in the presence of Vanna Scotland, MD.  03/08/23 10:44 AM   [name] 02/27/1945 [id number]  Referring provider: Dorothey Baseman, MD [phone] [place],  [place] [address]  Chief Complaint  Patient presents with   Prostate Cancer    HPI: 78 year old male who presents today for an annual follow-up regarding his prostate cancer management.   He underwent a PSMA PET scan that indicated focal activity in the left prostate gland that could represent a focal recurrence. He was referred to [facility] at that time for evaluation by Chana Bode. He discussed whether he was a candidate for salvage therapy such as HIFU or cryotherapy. He was not deemed a candidate. He was also evaluated by radiation oncology and felt that he could receive no additional local focal therapy. The plan was to manage him conservatively and initiate androgen deprivation therapy in the setting of symptoms and/or evidence of metastatic disease. It appears that he continued to follow up at [facility] in the interim as PSA continues to rise.   His PSMA in 03/2022 was negative for metastatic disease.    His most recent PSA on 03/03/2023 was 10.5 which has doubled over the past year.   He reports no new symptoms, including no changes in urination, no blood in urine, and no new aches or pains. He is asymptomatic and reports stable quality of life.   PSA: 0.11  04/2014 0.28  03/2015  0.51  05/2016 0.96  07/2017 1.48  01/2018 1.4    06/2018 1.6    12/2018 2.6    12/2019 3.3    03/2020 4.0    08/2020 4.89  11/2020 5.92  08/2021 6.1    09/2021 8.2    02/2022 10.5  03/03/2023  PMH: Past Medical History:  Diagnosis Date   Cancer (HCC)    COPD (chronic obstructive pulmonary disease) (HCC)    Diabetes mellitus without  complication (HCC)    Hyperlipidemia    Hypertension    Left thyroid nodule    Prostate CA (HCC)    Renal insufficiency     Surgical History: Past Surgical History:  Procedure Laterality Date   CARDIOVERSION N/A 11/26/2020   Procedure: CARDIOVERSION;  Surgeon: Lamar Blinks, MD;  Location: [facility] ORS;  Service: Cardiovascular;  Laterality: N/A;   COLONOSCOPY WITH PROPOFOL     COLONOSCOPY WITH PROPOFOL N/A 08/16/2018   Procedure: COLONOSCOPY WITH PROPOFOL;  Surgeon: Christena Deem, MD;  Location: [facility] ENDOSCOPY;  Service: Endoscopy;  Laterality: N/A;   COLONOSCOPY WITH PROPOFOL N/A 12/07/2021   Procedure: COLONOSCOPY WITH PROPOFOL;  Surgeon: Regis Bill, MD;  Location: [facility] ENDOSCOPY;  Service: Endoscopy;  Laterality: N/A;   ESOPHAGOGASTRODUODENOSCOPY (EGD) WITH PROPOFOL N/A 12/07/2021   Procedure: ESOPHAGOGASTRODUODENOSCOPY (EGD) WITH PROPOFOL;  Surgeon: Regis Bill, MD;  Location: [facility] ENDOSCOPY;  Service: Endoscopy;  Laterality: N/A;   insertion of radiation seeds     TEE WITHOUT CARDIOVERSION N/A 11/26/2020   Procedure: TRANSESOPHAGEAL ECHOCARDIOGRAM (TEE);  Surgeon: Lamar Blinks, MD;  Location: [facility] ORS;  Service: Cardiovascular;  Laterality: N/A;    Home Medications:  Allergies as of 03/08/2023       Reactions   Penicillins Swelling   Egg-derived Products Other (See Comments)        Medication List  Accurate as of March 08, 2023 10:44 AM. If you have any questions, ask your nurse or doctor.          albuterol 108 (90 Base) MCG/ACT inhaler Commonly known as: VENTOLIN HFA Inhale 2 puffs into the lungs every 6 (six) hours as needed for wheezing or shortness of breath.   amLODipine 5 MG tablet Commonly known as: NORVASC Take 5 mg by mouth daily.   apixaban 5 MG Tabs tablet Commonly known as: ELIQUIS Take 1 tablet (5 mg total) by mouth 2 (two) times daily.   aspirin EC 81 MG tablet Take 81 mg by mouth daily.   atorvastatin  40 MG tablet Commonly known as: LIPITOR Take 40 mg by mouth daily.   Fluticasone-Salmeterol 250-50 MCG/DOSE Aepb Commonly known as: ADVAIR INHALE 1 PUFF INTO THE LUNGS EVERY 12 HOURS   furosemide 20 MG tablet Commonly known as: LASIX TAKE 1 TABLET(20 MG) BY MOUTH EVERY DAY   hydrALAZINE 50 MG tablet Commonly known as: APRESOLINE Take 50 mg by mouth 3 (three) times daily.   Iron-Vitamin C 65-125 MG Tabs Take 1 tablet by mouth daily.   losartan 100 MG tablet Commonly known as: COZAAR Take 100 mg by mouth daily.   metFORMIN 850 MG tablet Commonly known as: GLUCOPHAGE Take 850 mg by mouth 2 (two) times daily.   metoprolol tartrate 100 MG tablet Commonly known as: LOPRESSOR Take 50 mg by mouth 2 (two) times daily.   sertraline 100 MG tablet Commonly known as: ZOLOFT Take 100 mg by mouth daily.   sildenafil 20 MG tablet Commonly known as: REVATIO Take 100 mg by mouth as needed.   Symbicort 80-4.5 MCG/ACT inhaler Generic drug: budesonide-formoterol Inhale 2 puffs into the lungs 2 (two) times daily.   tadalafil 20 MG tablet Commonly known as: CIALIS Take 20 mg by mouth as needed.   traZODone 100 MG tablet Commonly known as: DESYREL Take 100 mg by mouth at bedtime as needed for sleep.        Allergies:  Allergies  Allergen Reactions   Penicillins Swelling   Egg-Derived Products Other (See Comments)    Family History: Family History  Problem Relation Age of Onset   Diabetes Mother    Hypertension Father     Social History:  reports that he quit smoking about 24 years ago. His smoking use included cigarettes. He started smoking about 54 years ago. He has a 45 pack-year smoking history. He has never used smokeless tobacco. He reports that he does not currently use alcohol. He reports that he does not use drugs.   Physical Exam: BP (!) 163/67   Pulse 61   Ht 5\' 6"  (1.676 m)   Wt 255 lb 6 oz (115.8 kg)   BMI 41.22 kg/m   Constitutional:  Alert and  oriented, No acute distress.  Wife and daughter present today HEENT: [place] AT, moist mucus membranes.  Trachea midline, no masses. Neurologic: Grossly intact, no focal deficits, moving all 4 extremities. Psychiatric: Normal mood and affect.   Assessment & Plan:    1. Prostate cancer - PSA has doubled over past 2 years, now at 10.5  --> overall fairly low doubling time - PSMA scan from March 2024 was negative for metastatic disease. - He is asymptomatic with no urinary issues or new pain. - Discussed the option of repeating the PSMA scan to assess for potential spread, but he prefers to continue monitoring without immediate intervention. - Continue active surveillance. - Repeat PSA in  6 months. - Educated on signs of disease progression, including new persistent pain, especially in bones, weakness, or urinary changes, and advise to return if these occur.  Return in about 6 months (around 09/05/2023) for repeat PSA and in 1 year for office visit.  I have reviewed the above documentation for accuracy and completeness, and I agree with the above.   Vanna Scotland, MD    [facility] [address], [address] [place], [place] [address] [phone]

## 2023-03-08 NOTE — Progress Notes (Deleted)
 MRN : [id number]  [name] is a 78 y.o. (Dec 27, 1945) male who presents with chief complaint of legs hurt and swell.  History of Present Illness:   Patient is seen for follow up evaluation of leg pain and swelling associated with venous ulceration. The patient was recently seen here and started on Unna boot therapy.  The swelling abruptly became much worse bilaterally and is associated with pain and discoloration. The pain and swelling worsens with prolonged dependency and improves with elevation.  The patient notes that in the morning the legs are better but the leg symptoms worsened throughout the course of the day. The patient has also noted a progressive worsening of the discoloration in the ankle and shin area.   The patient notes that an ulcer has developed acutely without specific trauma and since it occurred it has been very slow to heal.  There is a moderate amount of drainage associated with the open area.  The wound is also painful.  The patient states that they have been elevating as much as possible. The patient denies any recent changes in medications.  The patient denies a history of DVT or PE. There is no prior history of phlebitis. There is no history of primary lymphedema.  No SOB or increased cough.  No sputum production.  No recent episodes of CHF exacerbation.  No outpatient medications have been marked as taking for the 03/09/23 encounter (Appointment) with Gilda Crease, Latina Craver, MD.    Past Medical History:  Diagnosis Date   Cancer [facility])    COPD (chronic obstructive pulmonary disease) (HCC)    Diabetes mellitus without complication (HCC)    Hyperlipidemia    Hypertension    Left thyroid nodule    Prostate CA [facility])    Renal insufficiency     Past Surgical History:  Procedure Laterality Date   CARDIOVERSION N/A 11/26/2020   Procedure: CARDIOVERSION;  Surgeon: Lamar Blinks, MD;  Location: [facility] ORS;  Service: Cardiovascular;  Laterality: N/A;    COLONOSCOPY WITH PROPOFOL     COLONOSCOPY WITH PROPOFOL N/A 08/16/2018   Procedure: COLONOSCOPY WITH PROPOFOL;  Surgeon: Christena Deem, MD;  Location: [facility] ENDOSCOPY;  Service: Endoscopy;  Laterality: N/A;   COLONOSCOPY WITH PROPOFOL N/A 12/07/2021   Procedure: COLONOSCOPY WITH PROPOFOL;  Surgeon: Regis Bill, MD;  Location: [facility] ENDOSCOPY;  Service: Endoscopy;  Laterality: N/A;   ESOPHAGOGASTRODUODENOSCOPY (EGD) WITH PROPOFOL N/A 12/07/2021   Procedure: ESOPHAGOGASTRODUODENOSCOPY (EGD) WITH PROPOFOL;  Surgeon: Regis Bill, MD;  Location: [facility] ENDOSCOPY;  Service: Endoscopy;  Laterality: N/A;   insertion of radiation seeds     TEE WITHOUT CARDIOVERSION N/A 11/26/2020   Procedure: TRANSESOPHAGEAL ECHOCARDIOGRAM (TEE);  Surgeon: Lamar Blinks, MD;  Location: [facility] ORS;  Service: Cardiovascular;  Laterality: N/A;    Social History Social History   Tobacco Use   Smoking status: Former    Current packs/day: 0.00    Average packs/day: 1.5 packs/day for 30.0 years (45.0 ttl pk-yrs)    Types: Cigarettes    Start date: 01/31/1969    Quit date: 02/01/1999    Years since quitting: 24.1   Smokeless tobacco: Never  Vaping Use   Vaping status: Never Used  Substance Use Topics   Alcohol use: Not Currently    Comment: 15 years ago   Drug use: Never    Family History Family History  Problem Relation Age of Onset   Diabetes Mother    Hypertension Father  Allergies  Allergen Reactions   Penicillins Swelling   Egg-Derived Products Other (See Comments)     REVIEW OF SYSTEMS (Negative unless checked)  Constitutional: [] Weight loss  [] Fever  [] Chills Cardiac: [] Chest pain   [] Chest pressure   [] Palpitations   [] Shortness of breath when laying flat   [] Shortness of breath with exertion. Vascular:  [] Pain in legs with walking   [x] Pain in legs at rest  [] History of DVT   [] Phlebitis   [x] Swelling in legs   [] Varicose veins   [] Non-healing ulcers Pulmonary:   [] Uses  home oxygen   [] Productive cough   [] Hemoptysis   [] Wheeze  [] COPD   [] Asthma Neurologic:  [] Dizziness   [] Seizures   [] History of stroke   [] History of TIA  [] Aphasia   [] Vissual changes   [] Weakness or numbness in arm   [] Weakness or numbness in leg Musculoskeletal:   [] Joint swelling   [] Joint pain   [] Low back pain Hematologic:  [] Easy bruising  [] Easy bleeding   [] Hypercoagulable state   [] Anemic Gastrointestinal:  [] Diarrhea   [] Vomiting  [] Gastroesophageal reflux/heartburn   [] Difficulty swallowing. Genitourinary:  [] Chronic kidney disease   [] Difficult urination  [] Frequent urination   [] Blood in urine Skin:  [] Rashes   [] Ulcers  Psychological:  [] History of anxiety   []  History of major depression.  Physical Examination  There were no vitals filed for this visit. There is no height or weight on file to calculate BMI. Gen: WD/WN, NAD Head: [place]/AT, No temporalis wasting.  Ear/Nose/Throat: Hearing grossly intact, nares w/o erythema or drainage, pinna without lesions Eyes: PER, EOMI, sclera nonicteric.  Neck: Supple, no gross masses.  No JVD.  Pulmonary:  Good air movement, no audible wheezing, no use of accessory muscles.  Cardiac: RRR, precordium not hyperdynamic. Vascular:  scattered varicosities present bilaterally.  Moderate venous stasis changes to the legs bilaterally.  2+ soft pitting edema. CEAP C4sEpAsPr   Vessel Right Left  Radial Palpable Palpable  Gastrointestinal: soft, non-distended. No guarding/no peritoneal signs.  Musculoskeletal: M/S 5/5 throughout.  No deformity.  Neurologic: CN 2-12 intact. Pain and light touch intact in extremities.  Symmetrical.  Speech is fluent. Motor exam as listed above. Psychiatric: Judgment intact, Mood & affect appropriate for pt's clinical situation. Dermatologic: Venous rashes no ulcers noted.  No changes consistent with cellulitis. Lymph : No lichenification or skin changes of chronic lymphedema.  CBC Lab Results  Component Value  Date   WBC 10.9 (H) 02/20/2023   HGB 11.4 (L) 02/20/2023   HCT 36.7 (L) 02/20/2023   MCV 87.6 02/20/2023   PLT 215 02/20/2023    BMET    Component Value Date/Time   NA 135 01/31/2018 1628   K 3.9 01/31/2018 1628   CL 99 01/31/2018 1628   CO2 27 01/31/2018 1628   GLUCOSE 171 (H) 01/31/2018 1628   BUN 17 01/31/2018 1628   CREATININE 1.51 (H) 01/31/2018 1628   CALCIUM 8.9 01/31/2018 1628   GFRNONAA 45 (L) 01/31/2018 1628   GFRAA 53 (L) 01/31/2018 1628   CrCl cannot be calculated (Patient's most recent lab result is older than the maximum 21 days allowed.).  COAG Lab Results  Component Value Date   INR 1.2 11/26/2020    Radiology No results found.   Assessment/Plan There are no diagnoses linked to this encounter.   Levora Dredge, MD  03/08/2023 8:49 PM

## 2023-03-09 ENCOUNTER — Encounter (INDEPENDENT_AMBULATORY_CARE_PROVIDER_SITE_OTHER): Payer: Medicare Other

## 2023-03-09 ENCOUNTER — Ambulatory Visit (INDEPENDENT_AMBULATORY_CARE_PROVIDER_SITE_OTHER): Payer: Medicare Other | Admitting: Vascular Surgery

## 2023-03-09 DIAGNOSIS — I10 Essential (primary) hypertension: Secondary | ICD-10-CM

## 2023-03-09 DIAGNOSIS — J449 Chronic obstructive pulmonary disease, unspecified: Secondary | ICD-10-CM

## 2023-03-09 DIAGNOSIS — I872 Venous insufficiency (chronic) (peripheral): Secondary | ICD-10-CM

## 2023-03-09 DIAGNOSIS — I890 Lymphedema, not elsewhere classified: Secondary | ICD-10-CM

## 2023-03-09 DIAGNOSIS — E782 Mixed hyperlipidemia: Secondary | ICD-10-CM

## 2023-03-16 ENCOUNTER — Encounter (INDEPENDENT_AMBULATORY_CARE_PROVIDER_SITE_OTHER): Payer: Medicare Other

## 2023-03-21 DIAGNOSIS — L97909 Non-pressure chronic ulcer of unspecified part of unspecified lower leg with unspecified severity: Secondary | ICD-10-CM | POA: Insufficient documentation

## 2023-03-21 NOTE — Progress Notes (Signed)
 MRN : [id number]  [name] is a 78 y.o. (November 06, 1945) male who presents with chief complaint of legs hurt and swell.  History of Present Illness:   Patient is seen for follow up evaluation of leg pain and swelling associated with venous ulceration. The patient was recently seen here and started on Unna boot therapy.  The swelling abruptly became much worse bilaterally and is associated with pain and discoloration. The pain and swelling worsens with prolonged dependency and improves with elevation.  The patient notes that in the morning the legs are better but the leg symptoms worsened throughout the course of the day. The patient has also noted a progressive worsening of the discoloration in the ankle and shin area.   The patient notes that an ulcer has developed acutely without specific trauma and since it occurred it has been very slow to heal.  There is a moderate amount of drainage associated with the open area.  The wound is also painful.  The patient states that they have been elevating as much as possible. The patient denies any recent changes in medications.  The patient denies a history of DVT or PE. There is no prior history of phlebitis. There is no history of primary lymphedema.  No SOB or increased cough.  No sputum production.  No recent episodes of CHF exacerbation.  No outpatient medications have been marked as taking for the 03/23/23 encounter (Appointment) with Gilda Crease, Latina Craver, MD.    Past Medical History:  Diagnosis Date   Cancer [facility])    COPD (chronic obstructive pulmonary disease) (HCC)    Diabetes mellitus without complication (HCC)    Hyperlipidemia    Hypertension    Left thyroid nodule    Prostate CA [facility])    Renal insufficiency     Past Surgical History:  Procedure Laterality Date   CARDIOVERSION N/A 11/26/2020   Procedure: CARDIOVERSION;  Surgeon: Lamar Blinks, MD;  Location: [facility] ORS;  Service: Cardiovascular;  Laterality: N/A;    COLONOSCOPY WITH PROPOFOL     COLONOSCOPY WITH PROPOFOL N/A 08/16/2018   Procedure: COLONOSCOPY WITH PROPOFOL;  Surgeon: Christena Deem, MD;  Location: [facility] ENDOSCOPY;  Service: Endoscopy;  Laterality: N/A;   COLONOSCOPY WITH PROPOFOL N/A 12/07/2021   Procedure: COLONOSCOPY WITH PROPOFOL;  Surgeon: Regis Bill, MD;  Location: [facility] ENDOSCOPY;  Service: Endoscopy;  Laterality: N/A;   ESOPHAGOGASTRODUODENOSCOPY (EGD) WITH PROPOFOL N/A 12/07/2021   Procedure: ESOPHAGOGASTRODUODENOSCOPY (EGD) WITH PROPOFOL;  Surgeon: Regis Bill, MD;  Location: [facility] ENDOSCOPY;  Service: Endoscopy;  Laterality: N/A;   insertion of radiation seeds     TEE WITHOUT CARDIOVERSION N/A 11/26/2020   Procedure: TRANSESOPHAGEAL ECHOCARDIOGRAM (TEE);  Surgeon: Lamar Blinks, MD;  Location: [facility] ORS;  Service: Cardiovascular;  Laterality: N/A;    Social History Social History   Tobacco Use   Smoking status: Former    Current packs/day: 0.00    Average packs/day: 1.5 packs/day for 30.0 years (45.0 ttl pk-yrs)    Types: Cigarettes    Start date: 01/31/1969    Quit date: 02/01/1999    Years since quitting: 24.1   Smokeless tobacco: Never  Vaping Use   Vaping status: Never Used  Substance Use Topics   Alcohol use: Not Currently    Comment: 15 years ago   Drug use: Never    Family History Family History  Problem Relation Age of Onset   Diabetes Mother    Hypertension Father  Allergies  Allergen Reactions   Penicillins Swelling   Egg-Derived Products Other (See Comments)     REVIEW OF SYSTEMS (Negative unless checked)  Constitutional: [] Weight loss  [] Fever  [] Chills Cardiac: [] Chest pain   [] Chest pressure   [] Palpitations   [] Shortness of breath when laying flat   [] Shortness of breath with exertion. Vascular:  [] Pain in legs with walking   [x] Pain in legs at rest  [] History of DVT   [] Phlebitis   [x] Swelling in legs   [] Varicose veins   [] Non-healing ulcers Pulmonary:   [] Uses  home oxygen   [] Productive cough   [] Hemoptysis   [] Wheeze  [] COPD   [] Asthma Neurologic:  [] Dizziness   [] Seizures   [] History of stroke   [] History of TIA  [] Aphasia   [] Vissual changes   [] Weakness or numbness in arm   [] Weakness or numbness in leg Musculoskeletal:   [] Joint swelling   [] Joint pain   [] Low back pain Hematologic:  [] Easy bruising  [] Easy bleeding   [] Hypercoagulable state   [] Anemic Gastrointestinal:  [] Diarrhea   [] Vomiting  [] Gastroesophageal reflux/heartburn   [] Difficulty swallowing. Genitourinary:  [] Chronic kidney disease   [] Difficult urination  [] Frequent urination   [] Blood in urine Skin:  [] Rashes   [] Ulcers  Psychological:  [] History of anxiety   []  History of major depression.  Physical Examination  There were no vitals filed for this visit. There is no height or weight on file to calculate BMI. Gen: WD/WN, NAD Head: [place]/AT, No temporalis wasting.  Ear/Nose/Throat: Hearing grossly intact, nares w/o erythema or drainage, pinna without lesions Eyes: PER, EOMI, sclera nonicteric.  Neck: Supple, no gross masses.  No JVD.  Pulmonary:  Good air movement, no audible wheezing, no use of accessory muscles.  Cardiac: RRR, precordium not hyperdynamic. Vascular:  scattered varicosities present bilaterally.  Moderate venous stasis changes to the legs bilaterally.  2+ soft pitting edema. CEAP C4sEpAsPr   Vessel Right Left  Radial Palpable Palpable  Gastrointestinal: soft, non-distended. No guarding/no peritoneal signs.  Musculoskeletal: M/S 5/5 throughout.  No deformity.  Neurologic: CN 2-12 intact. Pain and light touch intact in extremities.  Symmetrical.  Speech is fluent. Motor exam as listed above. Psychiatric: Judgment intact, Mood & affect appropriate for pt's clinical situation. Dermatologic: Venous rashes no ulcers noted.  No changes consistent with cellulitis. Lymph : No lichenification or skin changes of chronic lymphedema.  CBC Lab Results  Component Value  Date   WBC 10.9 (H) 02/20/2023   HGB 11.4 (L) 02/20/2023   HCT 36.7 (L) 02/20/2023   MCV 87.6 02/20/2023   PLT 215 02/20/2023    BMET    Component Value Date/Time   NA 135 01/31/2018 1628   K 3.9 01/31/2018 1628   CL 99 01/31/2018 1628   CO2 27 01/31/2018 1628   GLUCOSE 171 (H) 01/31/2018 1628   BUN 17 01/31/2018 1628   CREATININE 1.51 (H) 01/31/2018 1628   CALCIUM 8.9 01/31/2018 1628   GFRNONAA 45 (L) 01/31/2018 1628   GFRAA 53 (L) 01/31/2018 1628   CrCl cannot be calculated (Patient's most recent lab result is older than the maximum 21 days allowed.).  COAG Lab Results  Component Value Date   INR 1.2 11/26/2020    Radiology No results found.   Assessment/Plan There are no diagnoses linked to this encounter.   Levora Dredge, MD  03/21/2023 9:35 AM

## 2023-03-23 ENCOUNTER — Encounter (INDEPENDENT_AMBULATORY_CARE_PROVIDER_SITE_OTHER): Payer: Medicare Other

## 2023-03-23 ENCOUNTER — Encounter (INDEPENDENT_AMBULATORY_CARE_PROVIDER_SITE_OTHER): Payer: Self-pay | Admitting: Vascular Surgery

## 2023-03-23 ENCOUNTER — Ambulatory Visit (INDEPENDENT_AMBULATORY_CARE_PROVIDER_SITE_OTHER): Payer: Medicare Other | Admitting: Nurse Practitioner

## 2023-03-23 ENCOUNTER — Encounter (INDEPENDENT_AMBULATORY_CARE_PROVIDER_SITE_OTHER): Payer: Self-pay

## 2023-03-23 VITALS — BP 150/67 | HR 58 | Resp 18 | Ht 66.0 in | Wt 255.0 lb

## 2023-03-23 DIAGNOSIS — I890 Lymphedema, not elsewhere classified: Secondary | ICD-10-CM

## 2023-03-23 DIAGNOSIS — I872 Venous insufficiency (chronic) (peripheral): Secondary | ICD-10-CM

## 2023-03-23 DIAGNOSIS — I83009 Varicose veins of unspecified lower extremity with ulcer of unspecified site: Secondary | ICD-10-CM

## 2023-03-23 DIAGNOSIS — I10 Essential (primary) hypertension: Secondary | ICD-10-CM | POA: Diagnosis not present

## 2023-03-23 DIAGNOSIS — I739 Peripheral vascular disease, unspecified: Secondary | ICD-10-CM

## 2023-03-23 DIAGNOSIS — L97909 Non-pressure chronic ulcer of unspecified part of unspecified lower leg with unspecified severity: Secondary | ICD-10-CM | POA: Diagnosis not present

## 2023-03-23 DIAGNOSIS — J449 Chronic obstructive pulmonary disease, unspecified: Secondary | ICD-10-CM

## 2023-03-23 NOTE — Progress Notes (Signed)
 Subjective:    Patient ID: [name], male    DOB: [birth year], 78 y.o.   MRN: [id number] Chief Complaint  Patient presents with   Follow-up    Roland Rack [employer]s     The patient returns to the office for followup evaluation regarding leg swelling.  The swelling has improved quite a bit and the pain associated with swelling has decreased substantially. There have not been any interval development of a ulcerations or wounds.  He was in [employer] for approximately 4 weeks and has recently transition to utilizing medical grade compression stockings for the last 4 weeks as well.  Since the previous visit the patient has been wearing graduated compression stockings and has noted some improvement in the lymphedema. The patient has been using compression routinely morning until night.  The patient also states elevation during the day and exercise (such as walking) is being done too.       Review of Systems  Cardiovascular:  Positive for leg swelling.  All other systems reviewed and are negative.      Objective:   Physical Exam Vitals reviewed.  HENT:     Head: Normocephalic.  Cardiovascular:     Rate and Rhythm: Normal rate.  Pulmonary:     Effort: Pulmonary effort is normal.  Musculoskeletal:     Right lower leg: Edema present.     Left lower leg: Edema present.  Skin:    General: Skin is warm and dry.  Neurological:     Mental Status: He is alert and oriented to person, place, and time.  Psychiatric:        Mood and Affect: Mood normal.        Behavior: Behavior normal.        Thought Content: Thought content normal.        Judgment: Judgment normal.     BP (!) 150/67   Pulse (!) 58   Resp 18   Ht 5\' 6"  (1.676 m)   Wt 255 lb (115.7 kg)   BMI 41.16 kg/m   Past Medical History:  Diagnosis Date   Cancer (HCC)    COPD (chronic obstructive pulmonary disease) (HCC)    Diabetes mellitus without complication (HCC)    Hyperlipidemia    Hypertension    Left thyroid  nodule    Prostate CA (HCC)    Renal insufficiency     Social History   Socioeconomic History   Marital status: Married    Spouse name: Not on file   Number of children: Not on file   Years of education: Not on file   Highest education level: Not on file  Occupational History   Not on file  Tobacco Use   Smoking status: Former    Current packs/day: 0.00    Average packs/day: 1.5 packs/day for 30.0 years (45.0 ttl pk-yrs)    Types: Cigarettes    Start date: 01/31/1969    Quit date: 02/01/1999    Years since quitting: 24.1   Smokeless tobacco: Never  Vaping Use   Vaping status: Never Used  Substance and Sexual Activity   Alcohol use: Not Currently    Comment: 15 years ago   Drug use: Never   Sexual activity: Not on file  Other Topics Concern   Not on file  Social History Narrative   Not on file   Social Drivers of Health   Financial Resource Strain: Low Risk  (02/21/2022)   Received from [facility], [place]  [facility]   Overall Financial Resource Strain (CARDIA)    Difficulty of Paying Living Expenses: Not hard at all  Food Insecurity: No Food Insecurity (02/21/2022)   Received from [facility], [facility]   Hunger Vital Sign    Worried About Running Out of Food in the Last Year: Never true    Ran Out of Food in the Last Year: Never true  Transportation Needs: No Transportation Needs (02/21/2022)   Received from [facility], [facility]   [facility] - Transportation    In the past 12 months, has lack of transportation kept you from medical appointments or from getting medications?: No    Lack of Transportation (Non-Medical): No  Physical Activity: Not on file  Stress: Not on file  Social Connections: Not on file  Intimate Partner Violence: Not on file    Past Surgical History:  Procedure Laterality Date   CARDIOVERSION N/A 11/26/2020   Procedure: CARDIOVERSION;   Surgeon: Lamar Blinks, MD;  Location: [facility] ORS;  Service: Cardiovascular;  Laterality: N/A;   COLONOSCOPY WITH PROPOFOL     COLONOSCOPY WITH PROPOFOL N/A 08/16/2018   Procedure: COLONOSCOPY WITH PROPOFOL;  Surgeon: Christena Deem, MD;  Location: [facility] ENDOSCOPY;  Service: Endoscopy;  Laterality: N/A;   COLONOSCOPY WITH PROPOFOL N/A 12/07/2021   Procedure: COLONOSCOPY WITH PROPOFOL;  Surgeon: Regis Bill, MD;  Location: [facility] ENDOSCOPY;  Service: Endoscopy;  Laterality: N/A;   ESOPHAGOGASTRODUODENOSCOPY (EGD) WITH PROPOFOL N/A 12/07/2021   Procedure: ESOPHAGOGASTRODUODENOSCOPY (EGD) WITH PROPOFOL;  Surgeon: Regis Bill, MD;  Location: [facility] ENDOSCOPY;  Service: Endoscopy;  Laterality: N/A;   insertion of radiation seeds     TEE WITHOUT CARDIOVERSION N/A 11/26/2020   Procedure: TRANSESOPHAGEAL ECHOCARDIOGRAM (TEE);  Surgeon: Lamar Blinks, MD;  Location: [facility] ORS;  Service: Cardiovascular;  Laterality: N/A;    Family History  Problem Relation Age of Onset   Diabetes Mother    Hypertension Father     Allergies  Allergen Reactions   Penicillins Swelling   Egg-Derived Products Other (See Comments)       Latest Ref Rng & Units 02/20/2023    7:54 AM 10/18/2022    8:09 AM 06/15/2022   11:48 AM  CBC  WBC 4.0 - 10.5 K/uL 10.9  9.8  8.0   Hemoglobin 13.0 - 17.0 g/dL 78.2  95.6  21.3   Hematocrit 39.0 - 52.0 % 36.7  36.3  35.9   Platelets 150 - 400 K/uL 215  259  216       CMP     Component Value Date/Time   NA 135 01/31/2018 1628   K 3.9 01/31/2018 1628   CL 99 01/31/2018 1628   CO2 27 01/31/2018 1628   GLUCOSE 171 (H) 01/31/2018 1628   BUN 17 01/31/2018 1628   CREATININE 1.51 (H) 01/31/2018 1628   CALCIUM 8.9 01/31/2018 1628   GFRNONAA 45 (L) 01/31/2018 1628     No results found.     Assessment & Plan:   1. Lymphedema (Primary) Recommend:  No surgery or intervention at this point in time.   The Patient is CEAP [id number].  The patient has been  wearing compression for more than 12 weeks with no or little benefit.  The patient has been exercising daily for more than 12 weeks. The patient has been elevating and taking OTC pain medications for more than 12 weeks.  None of these have have eliminated the pain related  to the lymphedema or the discomfort regarding excessive swelling and venous congestion.    I have reviewed my discussion with the patient regarding lymphedema and why it  causes symptoms.  Patient will continue wearing graduated compression on a daily basis. The patient should put the compression on first thing in the morning and removing them in the evening. The patient should not sleep in the compression.   In addition, behavioral modification throughout the day will be continued.  This will include frequent elevation (such as in a recliner), use of over the counter pain medications as needed and exercise such as walking.  The systemic causes for chronic edema such as liver, kidney and cardiac etiologies do not appear to have significant changed over the past year.    The patient has chronic , severe lymphedema with hyperpigmentation of the skin and has done MLD, skin care, medication, diet, exercise, elevation and compression for 4 weeks with no improvement,  I am recommending a lymphedema pump.  The patient still has stage 3 lymphedema and therefore, I believe that a lymph pump is needed to improve the control of the patient's lymphedema and improve the quality of life.  Additionally, a lymph pump is warranted because it will reduce the risk of cellulitis and ulceration in the future.  Patient should follow-up in six months   2. Essential hypertension Continue antihypertensive medications as already ordered, these medications have been reviewed and there are no changes at this time.  3. Venous ulcer (HCC) Currently healed and will continue with compression as noted above   Current Outpatient Medications on File Prior to Visit   Medication Sig Dispense Refill   albuterol (PROVENTIL HFA;VENTOLIN HFA) 108 (90 Base) MCG/ACT inhaler Inhale 2 puffs into the lungs every 6 (six) hours as needed for wheezing or shortness of breath. 1 Inhaler 2   amLODipine (NORVASC) 5 MG tablet Take 5 mg by mouth daily.     apixaban (ELIQUIS) 5 MG TABS tablet Take 1 tablet (5 mg total) by mouth 2 (two) times daily. 60 tablet 6   aspirin EC 81 MG tablet Take 81 mg by mouth daily.     atorvastatin (LIPITOR) 40 MG tablet Take 40 mg by mouth daily.     Fluticasone-Salmeterol (ADVAIR) 250-50 MCG/DOSE AEPB INHALE 1 PUFF INTO THE LUNGS EVERY 12 HOURS     furosemide (LASIX) 20 MG tablet TAKE 1 TABLET(20 MG) BY MOUTH EVERY DAY     hydrALAZINE (APRESOLINE) 50 MG tablet Take 50 mg by mouth 3 (three) times daily.     Iron-Vitamin C 65-125 MG TABS Take 1 tablet by mouth daily. 30 tablet 3   losartan (COZAAR) 100 MG tablet Take 100 mg by mouth daily.     metFORMIN (GLUCOPHAGE) 850 MG tablet Take 850 mg by mouth 2 (two) times daily.     metoprolol tartrate (LOPRESSOR) 100 MG tablet Take 50 mg by mouth 2 (two) times daily.     sertraline (ZOLOFT) 100 MG tablet Take 100 mg by mouth daily.     sildenafil (REVATIO) 20 MG tablet Take 100 mg by mouth as needed.     SYMBICORT 80-4.5 MCG/ACT inhaler Inhale 2 puffs into the lungs 2 (two) times daily.     tadalafil (CIALIS) 20 MG tablet Take 20 mg by mouth as needed.     traZODone (DESYREL) 100 MG tablet Take 100 mg by mouth at bedtime as needed for sleep.     No current facility-administered medications on file prior to  visit.    There are no Patient Instructions on file for this visit. Return in about 3 months (around 06/23/2023).   Georgiana Spinner, NP

## 2023-03-30 ENCOUNTER — Ambulatory Visit (INDEPENDENT_AMBULATORY_CARE_PROVIDER_SITE_OTHER): Payer: Medicare Other | Admitting: Vascular Surgery

## 2023-06-23 ENCOUNTER — Ambulatory Visit (INDEPENDENT_AMBULATORY_CARE_PROVIDER_SITE_OTHER): Admitting: Nurse Practitioner

## 2023-06-23 ENCOUNTER — Encounter (INDEPENDENT_AMBULATORY_CARE_PROVIDER_SITE_OTHER): Payer: Self-pay | Admitting: Nurse Practitioner

## 2023-06-23 VITALS — BP 153/67 | HR 71 | Resp 18 | Ht 66.0 in | Wt 254.6 lb

## 2023-06-23 DIAGNOSIS — I890 Lymphedema, not elsewhere classified: Secondary | ICD-10-CM

## 2023-06-23 DIAGNOSIS — E782 Mixed hyperlipidemia: Secondary | ICD-10-CM | POA: Diagnosis not present

## 2023-06-23 DIAGNOSIS — I10 Essential (primary) hypertension: Secondary | ICD-10-CM

## 2023-06-23 NOTE — Progress Notes (Signed)
 Subjective:    Patient ID: [name], male    DOB: 07-17-1945, 78 y.o.   MRN: [id number] Chief Complaint  Patient presents with   Follow-up    3 month follow up     The patient returns to the office for followup evaluation regarding leg swelling.  The swelling has persisted but with the lymph pump is under much, much better controlled. The pain associated with swelling is decreased. There have not been any interval development of a ulcerations or wounds.  The patient denies problems with the pump, noting it is working well and the leggings are in good condition.  Since the previous visit the patient has been wearing graduated compression stockings and using the lymph pump on a routine basis and  has noted significant improvement in the lymphedema.   Patient stated the lymph pump has been helpful with the treatment of the lymphedema.      Review of Systems  Cardiovascular:  Positive for leg swelling.  Skin:  Negative for wound.  All other systems reviewed and are negative.      Objective:   Physical Exam Vitals reviewed.  HENT:     Head: Normocephalic.   Cardiovascular:     Rate and Rhythm: Normal rate.  Pulmonary:     Effort: Pulmonary effort is normal.   Musculoskeletal:     Right lower leg: Edema present.     Left lower leg: Edema present.   Skin:    General: Skin is warm and dry.     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   Psychiatric:        Mood and Affect: Mood normal.        Behavior: Behavior normal.        Thought Content: Thought content normal.        Judgment: Judgment normal.     BP (!) 153/67 (BP Location: Left Arm, Patient Position: Sitting, Cuff Size: Large)   Pulse 71   Resp 18   Ht 5' 6 (1.676 m)   Wt 254 lb 9.6 oz (115.5 kg)   BMI 41.09 kg/m   Past Medical History:  Diagnosis Date    Cancer (HCC)    COPD (chronic obstructive pulmonary disease) (HCC)    Diabetes mellitus without complication (HCC)    Hyperlipidemia    Hypertension    Left thyroid  nodule    Prostate CA (HCC)    Renal insufficiency     Social History   Socioeconomic History   Marital status: Married    Spouse name: Not on file   Number of children: Not on file   Years of education: Not on file   Highest education level: Not on file  Occupational History   Not on file  Tobacco Use   Smoking status: Former    Current packs/day: 0.00    Average packs/day: 1.5 packs/day for 30.0 years (45.0 ttl pk-yrs)    Types: Cigarettes    Start date: 01/31/1969    Quit date: 02/01/1999    Years since quitting: 24.4   Smokeless tobacco: Never  Vaping Use   Vaping status: Never Used  Substance and Sexual Activity   Alcohol use: Not Currently    Comment: 15 years ago   Drug use: Never   Sexual activity: Not on file  Other Topics Concern  Not on file  Social History Narrative   Not on file   Social Drivers of Health   Financial Resource Strain: Low Risk  (02/21/2022)   Received from [facility]   Overall Financial Resource Strain (CARDIA)    Difficulty of Paying Living Expenses: Not hard at all  Food Insecurity: No Food Insecurity (02/21/2022)   [facility]   Hunger Vital Sign    Within the past 12 months, you worried that your food would run out before you got the money to buy more.: Never true    Within the past 12 months, the food you bought just didn't last and you didn't have money to get more.: Never true  Transportation Needs: No Transportation Needs (02/21/2022)   Received from [facility] - Transportation    In the past 12 months, has lack of transportation kept you from medical appointments or from getting medications?: No    Lack of Transportation (Non-Medical): No  Physical Activity: Not on file  Stress: Not  on file  Social Connections: Not on file  Intimate Partner Violence: Not on file    Past Surgical History:  Procedure Laterality Date   CARDIOVERSION N/A 11/26/2020   Procedure: CARDIOVERSION;  Surgeon: Michelle Aid, MD;  Location: [facility] ORS;  Service: Cardiovascular;  Laterality: N/A;   COLONOSCOPY WITH PROPOFOL      COLONOSCOPY WITH PROPOFOL  N/A 08/16/2018   Procedure: COLONOSCOPY WITH PROPOFOL ;  Surgeon: Deveron Fly, MD;  Location: [facility] ENDOSCOPY;  Service: Endoscopy;  Laterality: N/A;   COLONOSCOPY WITH PROPOFOL  N/A 12/07/2021   Procedure: COLONOSCOPY WITH PROPOFOL ;  Surgeon: Shane Darling, MD;  Location: [facility] ENDOSCOPY;  Service: Endoscopy;  Laterality: N/A;   ESOPHAGOGASTRODUODENOSCOPY (EGD) WITH PROPOFOL  N/A 12/07/2021   Procedure: ESOPHAGOGASTRODUODENOSCOPY (EGD) WITH PROPOFOL ;  Surgeon: Shane Darling, MD;  Location: [facility] ENDOSCOPY;  Service: Endoscopy;  Laterality: N/A;   insertion of radiation seeds     TEE WITHOUT CARDIOVERSION N/A 11/26/2020   Procedure: TRANSESOPHAGEAL ECHOCARDIOGRAM (TEE);  Surgeon: Michelle Aid, MD;  Location: [facility] ORS;  Service: Cardiovascular;  Laterality: N/A;    Family History  Problem Relation Age of Onset   Diabetes Mother    Hypertension Father     Allergies  Allergen Reactions   Penicillins Swelling   Egg-Derived Products Other (See Comments)       Latest Ref Rng & Units 02/20/2023    7:54 AM 10/18/2022    8:09 AM 06/15/2022   11:48 AM  CBC  WBC 4.0 - 10.5 K/uL 10.9  9.8  8.0   Hemoglobin 13.0 - 17.0 g/dL 09.8  11.9  14.7   Hematocrit 39.0 - 52.0 % 36.7  36.3  35.9   Platelets 150 - 400 K/uL 215  259  216       CMP     Component Value Date/Time   NA 135 01/31/2018 1628   K 3.9 01/31/2018 1628   CL 99 01/31/2018 1628   CO2 27 01/31/2018 1628   GLUCOSE 171 (H) 01/31/2018 1628   BUN 17 01/31/2018 1628   CREATININE 1.51 (H) 01/31/2018 1628   CALCIUM 8.9 01/31/2018 1628   GFRNONAA 45 (L) 01/31/2018 1628      No results found.     Assessment & Plan:   1. Lymphedema (Primary) Recommend:  No surgery or intervention at this point in time.    I have reviewed my discussion with the patient regarding lymphedema and  why it  causes symptoms.  Patient will continue wearing graduated compression on a daily basis. The patient should put the compression on first thing in the morning and removing them in the evening. The patient should not sleep in the compression.   In addition, behavioral modification throughout the day will be continued.  This will include frequent elevation (such as in a recliner), use of over the counter pain medications as needed and exercise such as walking.  The systemic causes for chronic edema such as liver, kidney and cardiac etiologies does not appear to have significant changed over the past year.    The patient will continue aggressive use of the  lymph pump.  This will continue to improve the edema control and prevent sequela such as ulcers and infections.   The patient will follow-up in 6 months  2. Essential hypertension Continue antihypertensive medications as already ordered, these medications have been reviewed and there are no changes at this time.  3. Mixed hyperlipidemia Continue statin as ordered and reviewed, no changes at this time   Current Outpatient Medications on File Prior to Visit  Medication Sig Dispense Refill   albuterol  (PROVENTIL  HFA;VENTOLIN  HFA) 108 (90 Base) MCG/ACT inhaler Inhale 2 puffs into the lungs every 6 (six) hours as needed for wheezing or shortness of breath. 1 Inhaler 2   amLODipine (NORVASC) 5 MG tablet Take 5 mg by mouth daily.     apixaban  (ELIQUIS ) 5 MG TABS tablet Take 1 tablet (5 mg total) by mouth 2 (two) times daily. 60 tablet 6   aspirin EC 81 MG tablet Take 81 mg by mouth daily.     atorvastatin (LIPITOR) 40 MG tablet Take 40 mg by mouth daily.     Fluticasone-Salmeterol (ADVAIR) 250-50 MCG/DOSE AEPB INHALE 1 PUFF  INTO THE LUNGS EVERY 12 HOURS     furosemide (LASIX) 20 MG tablet TAKE 1 TABLET(20 MG) BY MOUTH EVERY DAY     hydrALAZINE (APRESOLINE) 50 MG tablet Take 50 mg by mouth 3 (three) times daily.     Iron -Vitamin C  65-125 MG TABS Take 1 tablet by mouth daily. 30 tablet 3   losartan (COZAAR) 100 MG tablet Take 100 mg by mouth daily.     metFORMIN (GLUCOPHAGE) 850 MG tablet Take 850 mg by mouth 2 (two) times daily.     metoprolol  tartrate (LOPRESSOR ) 100 MG tablet Take 50 mg by mouth 2 (two) times daily.     sertraline (ZOLOFT) 100 MG tablet Take 100 mg by mouth daily.     sildenafil (REVATIO) 20 MG tablet Take 100 mg by mouth as needed.     SYMBICORT 80-4.5 MCG/ACT inhaler Inhale 2 puffs into the lungs 2 (two) times daily.     tadalafil (CIALIS) 20 MG tablet Take 20 mg by mouth as needed.     traZODone (DESYREL) 100 MG tablet Take 100 mg by mouth at bedtime as needed for sleep.     No current facility-administered medications on file prior to visit.    There are no Patient Instructions on file for this visit. No follow-ups on file.   [facility]ick, NP

## 2023-08-21 ENCOUNTER — Inpatient Hospital Stay: Payer: Medicare Other | Attending: Oncology

## 2023-08-21 DIAGNOSIS — D500 Iron deficiency anemia secondary to blood loss (chronic): Secondary | ICD-10-CM

## 2023-08-21 DIAGNOSIS — Z79899 Other long term (current) drug therapy: Secondary | ICD-10-CM | POA: Diagnosis not present

## 2023-08-21 DIAGNOSIS — C61 Malignant neoplasm of prostate: Secondary | ICD-10-CM | POA: Insufficient documentation

## 2023-08-21 DIAGNOSIS — N184 Chronic kidney disease, stage 4 (severe): Secondary | ICD-10-CM | POA: Diagnosis not present

## 2023-08-21 DIAGNOSIS — D509 Iron deficiency anemia, unspecified: Secondary | ICD-10-CM | POA: Diagnosis present

## 2023-08-21 LAB — IRON AND TIBC
Iron: 56 ug/dL (ref 45–182)
Saturation Ratios: 19 % (ref 17.9–39.5)
TIBC: 290 ug/dL (ref 250–450)
UIBC: 234 ug/dL

## 2023-08-21 LAB — CBC WITH DIFFERENTIAL (CANCER CENTER ONLY)
Abs Immature Granulocytes: 0.08 K/uL — ABNORMAL HIGH (ref 0.00–0.07)
Basophils Absolute: 0.1 K/uL (ref 0.0–0.1)
Basophils Relative: 1 %
Eosinophils Absolute: 0.4 K/uL (ref 0.0–0.5)
Eosinophils Relative: 4 %
HCT: 37 % — ABNORMAL LOW (ref 39.0–52.0)
Hemoglobin: 11.4 g/dL — ABNORMAL LOW (ref 13.0–17.0)
Immature Granulocytes: 1 %
Lymphocytes Relative: 19 %
Lymphs Abs: 2 K/uL (ref 0.7–4.0)
MCH: 27.1 pg (ref 26.0–34.0)
MCHC: 30.8 g/dL (ref 30.0–36.0)
MCV: 88.1 fL (ref 80.0–100.0)
Monocytes Absolute: 1.2 K/uL — ABNORMAL HIGH (ref 0.1–1.0)
Monocytes Relative: 11 %
Neutro Abs: 6.9 K/uL (ref 1.7–7.7)
Neutrophils Relative %: 64 %
Platelet Count: 243 K/uL (ref 150–400)
RBC: 4.2 MIL/uL — ABNORMAL LOW (ref 4.22–5.81)
RDW: 13.3 % (ref 11.5–15.5)
WBC Count: 10.7 K/uL — ABNORMAL HIGH (ref 4.0–10.5)
nRBC: 0 % (ref 0.0–0.2)

## 2023-08-21 LAB — RETIC PANEL
Immature Retic Fract: 21.9 % — ABNORMAL HIGH (ref 2.3–15.9)
RBC.: 4.14 MIL/uL — ABNORMAL LOW (ref 4.22–5.81)
Retic Count, Absolute: 84.9 K/uL (ref 19.0–186.0)
Retic Ct Pct: 2.1 % (ref 0.4–3.1)
Reticulocyte Hemoglobin: 28 pg (ref >27.9)

## 2023-08-21 LAB — FERRITIN: Ferritin: 109 ng/mL (ref 24–336)

## 2023-08-24 ENCOUNTER — Encounter: Payer: Self-pay | Admitting: Oncology

## 2023-08-24 ENCOUNTER — Inpatient Hospital Stay: Payer: Medicare Other

## 2023-08-24 ENCOUNTER — Inpatient Hospital Stay (HOSPITAL_BASED_OUTPATIENT_CLINIC_OR_DEPARTMENT_OTHER): Payer: Medicare Other | Admitting: Oncology

## 2023-08-24 VITALS — BP 168/62 | HR 80 | Temp 98.0°F | Resp 18 | Wt 255.7 lb

## 2023-08-24 DIAGNOSIS — N184 Chronic kidney disease, stage 4 (severe): Secondary | ICD-10-CM

## 2023-08-24 DIAGNOSIS — D500 Iron deficiency anemia secondary to blood loss (chronic): Secondary | ICD-10-CM | POA: Diagnosis not present

## 2023-08-24 DIAGNOSIS — C61 Malignant neoplasm of prostate: Secondary | ICD-10-CM | POA: Diagnosis not present

## 2023-08-24 DIAGNOSIS — D509 Iron deficiency anemia, unspecified: Secondary | ICD-10-CM | POA: Diagnosis not present

## 2023-08-24 NOTE — Assessment & Plan Note (Addendum)
 Labs are reviewed and discussed with patient. Lab Results  Component Value Date   HGB 11.4 (L) 08/21/2023   TIBC 290 08/21/2023   IRONPCTSAT 19 08/21/2023   FERRITIN 109 08/21/2023    Hemoglobin stable.   I will hold off IV Venofer  for now. continue Vitron-C 1 tablet daily as maintenance.

## 2023-08-24 NOTE — Assessment & Plan Note (Signed)
 Previously managed by Dr. Penne. February 2025, PSA has significantly increased.  Patient preferred to be watched and he has an upcoming lab encounter with urology for repeating PSA.

## 2023-08-24 NOTE — Progress Notes (Signed)
 Hematology/Oncology Progress note [phone] [phone]            Patient Care Team: Justin Lenis, MD as PCP - General (Family Medicine) Justin Call, MD as Consulting Physician (Oncology)  REFERRING PROVIDER: Glover Lenis, MD   ASSESSMENT & PLAN:   IDA (iron  deficiency anemia) Labs are reviewed and discussed with patient. Lab Results  Component Value Date   HGB 11.4 (L) 08/21/2023   TIBC 290 08/21/2023   IRONPCTSAT 19 08/21/2023   FERRITIN 109 08/21/2023    Hemoglobin stable.   I will hold off IV Venofer  for now. continue Vitron-C 1 tablet daily as maintenance.    Chronic kidney disease (CKD), active medical management without dialysis, stage 4 (severe) (HCC) Avoid nephrotoxins. There might be a component of anemia secondary to chronic kidney disease.  Prostate CA [facility]) Previously managed by Dr. Penne. February 2025, PSA has significantly increased.  Patient preferred to be watched and he has an upcoming lab encounter with urology for repeating PSA.     Orders Placed This Encounter  Procedures   CBC with Differential (Cancer Center Only)    Standing Status:   Future    Expected Date:   02/24/2024    Expiration Date:   05/24/2024   Iron  and TIBC    Standing Status:   Future    Expected Date:   02/24/2024    Expiration Date:   05/24/2024   Ferritin    Standing Status:   Future    Expected Date:   02/24/2024    Expiration Date:   05/24/2024   Retic Panel    Standing Status:   Future    Expected Date:   02/24/2024    Expiration Date:   05/24/2024   Follow-up in 6 months. All questions were answered. The patient knows to Oneal the clinic with any problems, questions or concerns.  [name], MD, PhD [facility] Hematology Oncology 08/24/2023 '  CHIEF COMPLAINTS/REASON FOR VISIT:  Evaluation of iron  deficiency anemia  HISTORY OF PRESENTING ILLNESS:   [name] is a  78 y.o.  male with PMH listed below was seen in  consultation at the request of  Justin Lenis, MD  for evaluation of anemia. 11/23/2021, CBC showed hemoglobin 8.1, MCV 69.7, hematocrit 29.4, Iron  panel showed saturation of 5%, TIBC 457, ferritin 28. 12/08/2021, status post EGD and colonoscopy please find use of erosive gastropathy with no stigmata of recent bleeding.  Biopsy showed very mild antral predominant chronic gastritis.  No significant intestinal metaplastic, dysplastic or glandular atrophic.  Immunohistochemical stain for H. pylori will be performed.  Results will be issued in an addendum. Patient was accompanied by her daughter today. She reports feeling okay.  Chronic fatigue, unchanged. Patient denies any black stool or bright red blood in the stool.  History of prostate cancer, status post brachytherapy with rising PSA.  He follows up with  urology. 03/30/2022 repeat PMSA-negative for metastatic disease.  2 sites in the prostate consistent with locally recurrent prostate cancer.  Dr. Penne recommends hold off treatment at this point in the absence of symptoms.  Urology will follow-up on PSA  INTERVAL HISTORY [name] is a 78 y.o. male who has above history reviewed by me today presents for follow up visit for iron  deficiency anemia.  He tolerates IV venofer . Fatigue has improved.  He follows up with urology for prostate cancer surveillance.   MEDICAL HISTORY:  Past Medical History:  Diagnosis Date   Cancer (HCC)  COPD (chronic obstructive pulmonary disease) (HCC)    Diabetes mellitus without complication (HCC)    Hyperlipidemia    Hypertension    Left thyroid  nodule    Prostate CA (HCC)    Renal insufficiency     SURGICAL HISTORY: Past Surgical History:  Procedure Laterality Date   CARDIOVERSION N/A 11/26/2020   Procedure: CARDIOVERSION;  Surgeon: Hester Wolm PARAS, MD;  Location: [facility] ORS;  Service: Cardiovascular;  Laterality: N/A;   COLONOSCOPY WITH PROPOFOL      COLONOSCOPY WITH PROPOFOL  N/A  08/16/2018   Procedure: COLONOSCOPY WITH PROPOFOL ;  Surgeon: Gaylyn Gladis PENNER, MD;  Location: [facility] ENDOSCOPY;  Service: Endoscopy;  Laterality: N/A;   COLONOSCOPY WITH PROPOFOL  N/A 12/07/2021   Procedure: COLONOSCOPY WITH PROPOFOL ;  Surgeon: Maryruth Ole DASEN, MD;  Location: [facility] ENDOSCOPY;  Service: Endoscopy;  Laterality: N/A;   ESOPHAGOGASTRODUODENOSCOPY (EGD) WITH PROPOFOL  N/A 12/07/2021   Procedure: ESOPHAGOGASTRODUODENOSCOPY (EGD) WITH PROPOFOL ;  Surgeon: Maryruth Ole DASEN, MD;  Location: [facility] ENDOSCOPY;  Service: Endoscopy;  Laterality: N/A;   insertion of radiation seeds     TEE WITHOUT CARDIOVERSION N/A 11/26/2020   Procedure: TRANSESOPHAGEAL ECHOCARDIOGRAM (TEE);  Surgeon: Hester Wolm PARAS, MD;  Location: [facility] ORS;  Service: Cardiovascular;  Laterality: N/A;    SOCIAL HISTORY: Social History   Socioeconomic History   Marital status: Married    Spouse name: Not on file   Number of children: Not on file   Years of education: Not on file   Highest education level: Not on file  Occupational History   Not on file  Tobacco Use   Smoking status: Former    Current packs/day: 0.00    Average packs/day: 1.5 packs/day for 30.0 years (45.0 ttl pk-yrs)    Types: Cigarettes    Start date: 01/31/1969    Quit date: 02/01/1999    Years since quitting: 24.5   Smokeless tobacco: Never  Vaping Use   Vaping status: Never Used  Substance and Sexual Activity   Alcohol use: Not Currently    Comment: 15 years ago   Drug use: Never   Sexual activity: Not on file  Other Topics Concern   Not on file  Social History Narrative   Not on file   Social Drivers of Health   Financial Resource Strain: Low Risk  (02/21/2022)   Received from [facility]   Overall Financial Resource Strain (CARDIA)    Difficulty of Paying Living Expenses: Not hard at all  Food Insecurity: No Food Insecurity (02/21/2022)   Received from [facility]   Hunger Vital Sign     Within the past 12 months, you worried that your food would run out before you got the money to buy more.: Never true    Within the past 12 months, the food you bought just didn't last and you didn't have money to get more.: Never true  Transportation Needs: No Transportation Needs (02/21/2022)   Received from [facility] - Transportation    In the past 12 months, has lack of transportation kept you from medical appointments or from getting medications?: No    Lack of Transportation (Non-Medical): No  Physical Activity: Not on file  Stress: Not on file  Social Connections: Not on file  Intimate Partner Violence: Not on file    FAMILY HISTORY: Family History  Problem Relation Age of Onset   Diabetes Mother    Hypertension Father     ALLERGIES:  is allergic to penicillins  and egg-derived products.  MEDICATIONS:  Current Outpatient Medications  Medication Sig Dispense Refill   albuterol  (PROVENTIL  HFA;VENTOLIN  HFA) 108 (90 Base) MCG/ACT inhaler Inhale 2 puffs into the lungs every 6 (six) hours as needed for wheezing or shortness of breath. 1 Inhaler 2   amLODipine (NORVASC) 5 MG tablet Take 5 mg by mouth daily.     apixaban  (ELIQUIS ) 5 MG TABS tablet Take 1 tablet (5 mg total) by mouth 2 (two) times daily. 60 tablet 6   aspirin EC 81 MG tablet Take 81 mg by mouth daily.     atorvastatin (LIPITOR) 40 MG tablet Take 40 mg by mouth daily.     Fluticasone-Salmeterol (ADVAIR) 250-50 MCG/DOSE AEPB INHALE 1 PUFF INTO THE LUNGS EVERY 12 HOURS     furosemide (LASIX) 20 MG tablet TAKE 1 TABLET(20 MG) BY MOUTH EVERY DAY     hydrALAZINE (APRESOLINE) 50 MG tablet Take 50 mg by mouth 3 (three) times daily.     Iron -Vitamin C  65-125 MG TABS Take 1 tablet by mouth daily. 30 tablet 3   losartan (COZAAR) 100 MG tablet Take 100 mg by mouth daily.     metFORMIN (GLUCOPHAGE) 850 MG tablet Take 850 mg by mouth 2 (two) times daily.     metoprolol  tartrate (LOPRESSOR ) 100 MG  tablet Take 50 mg by mouth 2 (two) times daily.     sertraline (ZOLOFT) 100 MG tablet Take 100 mg by mouth daily.     sildenafil (REVATIO) 20 MG tablet Take 100 mg by mouth as needed.     SYMBICORT 80-4.5 MCG/ACT inhaler Inhale 2 puffs into the lungs 2 (two) times daily.     tadalafil (CIALIS) 20 MG tablet Take 20 mg by mouth as needed.     traZODone (DESYREL) 100 MG tablet Take 100 mg by mouth at bedtime as needed for sleep.     No current facility-administered medications for this visit.    Review of Systems  Constitutional:  Negative for appetite change, chills, fever and unexpected weight change.  HENT:   Negative for hearing loss and voice change.   Eyes:  Negative for eye problems and icterus.  Respiratory:  Negative for chest tightness, cough and shortness of breath.   Cardiovascular:  Negative for chest pain and leg swelling.  Gastrointestinal:  Negative for abdominal distention and abdominal pain.  Endocrine: Negative for hot flashes.  Genitourinary:  Negative for difficulty urinating, dysuria and frequency.   Musculoskeletal:  Negative for arthralgias.  Skin:  Negative for itching and rash.  Neurological:  Negative for light-headedness and numbness.  Hematological:  Negative for adenopathy. Does not bruise/bleed easily.  Psychiatric/Behavioral:  Negative for confusion.    PHYSICAL EXAMINATION: ECOG PERFORMANCE STATUS: 1 - Symptomatic but completely ambulatory Vitals:   08/24/23 1348 08/24/23 1354  BP: (!) 170/61 (!) 168/62  Pulse: 80   Resp: 18   Temp: 98 F (36.7 C)    Filed Weights   08/24/23 1348  Weight: 255 lb 11.2 oz (116 kg)    Physical Exam Constitutional:      General: He is not in acute distress.    Appearance: He is obese.  HENT:     Head: Normocephalic and atraumatic.  Eyes:     General: No scleral icterus. Cardiovascular:     Rate and Rhythm: Normal rate.  Pulmonary:     Effort: Pulmonary effort is normal. No respiratory distress.     Breath  sounds: No wheezing.  Abdominal:  General: Bowel sounds are normal. There is no distension.     Palpations: Abdomen is soft.  Musculoskeletal:        General: No deformity. Normal range of motion.     Cervical back: Normal range of motion and neck supple.  Skin:    General: Skin is warm and dry.     Findings: No erythema or rash.  Neurological:     Mental Status: He is alert and oriented to person, place, and time. Mental status is at baseline.     Cranial Nerves: No cranial nerve deficit.  Psychiatric:        Mood and Affect: Mood normal.     LABORATORY DATA:  I have reviewed the data as listed    Latest Ref Rng & Units 08/21/2023    7:54 AM 02/20/2023    7:54 AM 10/18/2022    8:09 AM  CBC  WBC 4.0 - 10.5 K/uL 10.7  10.9  9.8   Hemoglobin 13.0 - 17.0 g/dL 88.5  88.5  88.9   Hematocrit 39.0 - 52.0 % 37.0  36.7  36.3   Platelets 150 - 400 K/uL 243  215  259       Latest Ref Rng & Units 01/31/2018    4:28 PM  CMP  Glucose 70 - 99 mg/dL 828   BUN 8 - 23 mg/dL 17   Creatinine 9.38 - 1.24 mg/dL 8.48   Sodium 864 - 854 mmol/L 135   Potassium 3.5 - 5.1 mmol/L 3.9   Chloride 98 - 111 mmol/L 99   CO2 22 - 32 mmol/L 27   Calcium 8.9 - 10.3 mg/dL 8.9       RADIOGRAPHIC STUDIES: I have personally reviewed the radiological images as listed and agreed with the findings in the report. No results found.

## 2023-08-24 NOTE — Assessment & Plan Note (Signed)
 Avoid nephrotoxins. There might be a component of anemia secondary to chronic kidney disease.

## 2023-08-29 ENCOUNTER — Encounter: Payer: Self-pay | Admitting: Urology

## 2023-09-01 ENCOUNTER — Other Ambulatory Visit: Payer: Medicare Other

## 2023-09-05 ENCOUNTER — Encounter: Payer: Self-pay | Admitting: Urology

## 2023-09-05 ENCOUNTER — Other Ambulatory Visit

## 2023-09-05 DIAGNOSIS — C61 Malignant neoplasm of prostate: Secondary | ICD-10-CM

## 2023-09-06 LAB — PSA: Prostate Specific Ag, Serum: 14.6 ng/mL — ABNORMAL HIGH (ref 0.0–4.0)

## 2023-10-11 NOTE — Progress Notes (Signed)
 Chief Complaint:   Chief Complaint  Patient presents with  . Follow-up    Follow up    Subjective:  [name] is a 78 y.o. male in today for recheck. 1.  Prostate cancer. He has been followed by Urology.  He had radioactive seeding treatment in 2014.  PSA has increased in recent years, 14.6 when last checked 09/05/23.  PET scan in 2022 showed one spot of prostate cancer, with no sign of metastasis, and watchful waiting was chosen.  He denies dysuria, increased frequency or decreased stream.  Has urgency and leakage at times.   2.  Diabetes mellitus.  He is on metformin 850 mg twice a day.  Blood sugars have stayed well controlled. He has no increased thirst, urination or hypoglycemic symptoms.  He has no retinopathy.  HgA1c was 8.1 on last check on 02/21/22; microalbuminuria was 237 in 2023. 3.  Hypertension.  He is on Hydralazine, Amlodipine, Losartan, Lasix and metoprolol  daily.  Blood pressures have run around 130-140's/60-70's.  He has no chest pain, headaches or dizziness.  He has SOB with minimal exertion; does not do much physical activity.  He has chronic lymphedema and is followed by Vascular.  He has had elevated creatinine, 2.3 on last check 02/21/22. 4.  Hyperlipidemia.  He is on Atorvastatin 40 mg daily.  Lipids checked in 2023 showed Total cholesterol of 116, HDL 33, LDL 65, and Triglycerides 92.  No myalgias, muscle weakness, nausea, or abdominal pain. He has fast foods rarely and fried foods about once a week. 5.  Atrial fibrillation/flutter.  He had cardioversion 2023 and seems to stay in sinus rhythm.  No palpitations or abnormal heart beats that he's aware of.  No presyncope, dyspnea or chest symptoms. Is on Eliquis  but admits taking it only once daily in the AM; he stopped the evening dose because he thought it caused him to feel lightheaded.  Past Medical History:  Diagnosis Date  . Colon polyp   . Hyperlipidemia   . Hypertension   . Left thyroid  nodule    Biopsy  03/2014: benign. Repeat US  03/2015: no growth. No routine neck US  needed.   . Prostate cancer (CMS/HHS-HCC)   . Renal insufficiency   . Seasonal allergic rhinitis   . Type 2 diabetes mellitus (CMS/HHS-HCC)    Past Surgical History:  Procedure Laterality Date  . COLONOSCOPY  08/16/2018   Tubular adenoma of the colon/Repeat 92yrs/MUS  . Colon @ [facility]  12/07/2021   Colonoscopy with inadequate prep, repeat colonoscopy in 1 year/CTL  . EGD @ [facility]  12/07/2021   Gastritis/Repeat PRN/CTL  . COLONOSCOPY  08/24/07; 11/05/12  . COLONOSCOPY    . INSERTION PROSTATE RADIATION SEED     Family History  Problem Relation Name Age of Onset  . Diabetes type II Mother    . Stroke Father     Social History   Socioeconomic History  . Marital status: Married  Tobacco Use  . Smoking status: Former    Current packs/day: 0.00    Average packs/day: 1 pack/day for 40.0 years (40.0 ttl pk-yrs)    Types: Cigarettes    Start date: 04/10/1961    Quit date: 04/10/2001    Years since quitting: 22.5  . Smokeless tobacco: Former  Advertising account planner  . Vaping status: Never Used  Substance and Sexual Activity  . Alcohol use: No    Alcohol/week: 0.0 standard drinks of alcohol  . Drug use: No  . Sexual activity: Yes   Social Drivers  of Health   Financial Resource Strain: Low Risk  (02/21/2022)   Overall Financial Resource Strain (CARDIA)   . Difficulty of Paying Living Expenses: Not hard at all  Food Insecurity: No Food Insecurity (02/21/2022)   Hunger Vital Sign   . Worried About Programme researcher, broadcasting/film/video in the Last Year: Never true   . Ran Out of Food in the Last Year: Never true  Transportation Needs: No Transportation Needs (02/21/2022)   PRAPARE - Transportation   . Lack of Transportation (Medical): No   . Lack of Transportation (Non-Medical): No  Housing Stability: Unknown (06/08/2023)   Housing Stability Vital Sign   . Unable to Pay for Housing in the Last Year: No   . Homeless in the Last Year: No   Outpatient  Medications Prior to Visit  Medication Sig Dispense Refill  . albuterol  MDI, PROVENTIL , VENTOLIN , PROAIR , HFA 90 mcg/actuation inhaler Inhale 2 inhalations into the lungs every 4 (four) hours as needed for Wheezing 8.5 g 0  . amLODIPine (NORVASC) 5 MG tablet TAKE 1 TABLET BY MOUTH ONCE  DAILY 90 tablet 1  . apixaban  (ELIQUIS ) 5 mg tablet TAKE 1 TABLET BY MOUTH TWICE  DAILY 180 tablet 1  . atorvastatin (LIPITOR) 40 MG tablet TAKE 1 TABLET BY MOUTH ONCE  DAILY 90 tablet 3  . FUROsemide (LASIX) 20 MG tablet TAKE 2 TABLETS BY MOUTH TWICE  DAILY 360 tablet 1  . hydrALAZINE (APRESOLINE) 50 MG tablet TAKE 1 TABLET BY MOUTH 3 TIMES  DAILY 270 tablet 3  . losartan (COZAAR) 100 MG tablet TAKE 1 TABLET BY MOUTH ONCE  DAILY 30 tablet 2  . metFORMIN (GLUCOPHAGE) 850 MG tablet TAKE 1 TABLET BY MOUTH TWICE  DAILY WITH MEALS 180 tablet 3  . metoprolol  tartrate (LOPRESSOR ) 100 MG tablet TAKE 1 TABLET BY MOUTH ONCE  DAILY 90 tablet 3  . sertraline (ZOLOFT) 100 MG tablet Take 100 mg by mouth once daily    . sildenafil, pulm.hypertension, (REVATIO) 20 mg tablet Take 5 tablets (100 mg total) by mouth as needed 5 tablet 2  . traZODone (DESYREL) 100 MG tablet TAKE 1 TABLET BY MOUTH ONCE DAILY AS NEEDED AT BEDTIME     No facility-administered medications prior to visit.   Allergies  Allergen Reactions  . Egg Derived Unknown  . Penicillins Other (See Comments)    Objective:   Vitals:   10/11/23 0858  BP: (!) 142/66  Pulse: 72  SpO2: 99%  Weight: (!) 117.5 kg (259 lb)  Height: 167.6 cm (5' 5.98)  PainSc: 0-No pain   Body mass index is 41.82 kg/m.   GENERAL:  Patient is alert and oriented, in no acute distress.  HEENT:   Conjunctivae are noninjected.  Oropharynx:  Moist mucous membranes.  No erythema  or lesions. RESPIRATORY:  Normal inspiratory and expiratory effort.  Lungs are clear to auscultation bilaterally. CARDIOVASCULAR:  Irregular rhythm, S1, S2, without murmur, rub, or gallop. ABDOMEN:   Obese, non-tender and non-distended.   EXTREMITIES:  +2 pulses bilaterally.  Chronic lymphedema, no pitting edema bilaterally.   Assessment/Plan:  Prostate cancer (CMS-HCC)  (primary encounter diagnosis).  Check free and total PSA, and a U/A.  Will notify Urology of increased level and let them decide if he needs to be seen sooner than scheduled visit early next year.  Primary hypertension.  Stay on all current medications.  Monitor BP.  Check CBC and Met B.  Hyperlipidemia, unspecified hyperlipidemia type.  Check lipids and adjust statin dose  if needed.  Type 2 diabetes mellitus without complication, without long-term current use of insulin (CMS-HCC).  Stay on current medications for now.  Work on weight loss.  Check A1c, Met B and MA.  Renal insufficiency.  Monitor Met B.  Atrial fibrillation/flutter.  Follow up with Cardio as directed. Try taking Eliquis  twice daily again but if not tolerated, notify Cardiology.  Follow up in 6 months for recheck and health maintenance issues, sooner as needed for acute concerns.    Alm Na, MD, PhD

## 2023-10-16 NOTE — Assessment & Plan Note (Signed)
 Rising PSA state with BCR following brachytherapy   Unfavorable intermediate risk CaP (dx in 2014) S/p brachytherapy in 2014 - PSA nadir 0.11 Rising PSA since ~2017 PSMA/PET (2022) - focal avidity in left prostate hemigland, negative for mets  His most recent PSA is 14.6 (from 10.5 in Feb 2025).  PSADT still approximates ~1.5-2 years.  Last PSMA staging was in 2022-no known metastatic disease.   I had a thoughtful conversation with him and his family today.  I reviewed both spectrums of management, from watchful waiting to minimal PSA surveillance to aggressive diagnostics with interval PSMA PET, possible hormonal deprivation.  Considering his current quality of life, asymptomatic state-I think it remains reasonable to continue routine PSA surveillance.  Patient was aligned with this approach as well.  Continue every 6 month PSA, shared decision making at each checkup re: interval scan.  If onset of classic symptoms or signs of metastatic disease, can restage and initiate ADT.   - follow up in 6 mo with PSA

## 2023-10-16 NOTE — Progress Notes (Unsigned)
   10/19/2023 8:19 PM   [name] 03/15/1945 [id number]  Reason for visit: Follow up Prostate cancer   HPI: 78 y.o. male, initial follow up with me today, previously seen by Dr. Penne in Feb 2025  Prior HPI: Unfavorable intermediate risk CaP (dx in 2014) S/p brachytherapy in 2014   - PSA nadir 0.11 Rising PSA since ~2017 PSMA/PET (2022) - focal avidity in left prostate hemigland, negative for mets [facility] - not a candidate for salvage XRT or further local treatment Dr. Penne in 2024 - PSA surveillance only, deferring ADT unless symptomatic or metastasis   Physical Exam: There were no vitals taken for this visit.   Constitutional:  Alert and oriented, No acute distress.  Laboratory Data: PSA: 0.11  04/2014 0.28  03/2015  0.51  05/2016 0.96  07/2017 1.48  01/2018 1.4    06/2018 1.6    12/2018 2.6    12/2019 3.3    03/2020 4.0    08/2020 4.89  11/2020 5.92  08/2021 6.1    09/2021 8.2    02/2022 10.5  03/03/2023 14.6  08/2023  Pertinent Imaging: N/A      Assessment & Plan:    Prostate CA [facility]) Assessment & Plan: Rising PSA state with BCR following brachytherapy   Unfavorable intermediate risk CaP (dx in 2014) S/p brachytherapy in 2014 - PSA nadir 0.11 Rising PSA since ~2017 PSMA/PET (2022) - focal avidity in left prostate hemigland, negative for mets  His most recent PSA is 14.6 (from 10.5 in Feb 2025).  PSADT still approximates ~1.5-2 years.  Last PSMA staging was in 2022-no known metastatic disease.         Justin JONELLE Skye, MD  [facility] [address] [place], [place] [address] [phone]

## 2023-10-19 ENCOUNTER — Ambulatory Visit: Admitting: Urology

## 2023-10-19 ENCOUNTER — Encounter: Payer: Self-pay | Admitting: Urology

## 2023-10-19 VITALS — BP 151/60 | HR 79 | Ht 66.0 in | Wt 260.0 lb

## 2023-10-19 DIAGNOSIS — C61 Malignant neoplasm of prostate: Secondary | ICD-10-CM

## 2023-10-19 NOTE — Addendum Note (Signed)
 Addended by: Keesha Pellum E on: 10/19/2023 01:52 PM   Modules accepted: Orders

## 2023-12-20 NOTE — Progress Notes (Signed)
 MRN : [id number]  [name] is a 78 y.o. (12-31-1945) male who presents with chief complaint of legs swell.  History of Present Illness:   The patient returns to the office for followup evaluation regarding leg swelling.  The swelling has persisted but with the lymph pump is under much, much better controlled. The pain associated with swelling is decreased.   There has been an interval development of an ulceration of the left lateral ankle.  He denies cellulitis but his family has noticed an increased odor.  The patient denies problems with the pump, noting it is working well and the leggings are in good condition.   Since the previous visit the patient has been having difficulty wearing graduated compression but has continued using the lymph pump on a routine basis and  has noted significant improvement in the lymphedema.    Patient stated the lymph pump has been helpful with the treatment of the lymphedema.   ABI's dated 05/02/2023 Rt=[place] (TBI=0.76; Triphasic) and Lt=[place] (TBI=0.71; Triphasic).  No outpatient medications have been marked as taking for the 12/21/23 encounter (Appointment) with Jama, Cordella MATSU, MD.    Past Medical History:  Diagnosis Date   Cancer [facility])    COPD (chronic obstructive pulmonary disease) (HCC)    Diabetes mellitus without complication (HCC)    Hyperlipidemia    Hypertension    Left thyroid  nodule    Prostate CA [facility])    Renal insufficiency     Past Surgical History:  Procedure Laterality Date   CARDIOVERSION N/A 11/26/2020   Procedure: CARDIOVERSION;  Surgeon: Hester Wolm PARAS, MD;  Location: [facility] ORS;  Service: Cardiovascular;  Laterality: N/A;   COLONOSCOPY WITH PROPOFOL      COLONOSCOPY WITH PROPOFOL  N/A 08/16/2018   Procedure: COLONOSCOPY WITH PROPOFOL ;  Surgeon: Gaylyn Gladis PENNER, MD;  Location: [facility] ENDOSCOPY;  Service: Endoscopy;  Laterality: N/A;   COLONOSCOPY WITH PROPOFOL  N/A 12/07/2021    Procedure: COLONOSCOPY WITH PROPOFOL ;  Surgeon: Maryruth Ole DASEN, MD;  Location: [facility] ENDOSCOPY;  Service: Endoscopy;  Laterality: N/A;   ESOPHAGOGASTRODUODENOSCOPY (EGD) WITH PROPOFOL  N/A 12/07/2021   Procedure: ESOPHAGOGASTRODUODENOSCOPY (EGD) WITH PROPOFOL ;  Surgeon: Maryruth Ole DASEN, MD;  Location: [facility] ENDOSCOPY;  Service: Endoscopy;  Laterality: N/A;   insertion of radiation seeds     TEE WITHOUT CARDIOVERSION N/A 11/26/2020   Procedure: TRANSESOPHAGEAL ECHOCARDIOGRAM (TEE);  Surgeon: Hester Wolm PARAS, MD;  Location: [facility] ORS;  Service: Cardiovascular;  Laterality: N/A;    Social History Social History   Tobacco Use   Smoking status: Former    Current packs/day: 0.00    Average packs/day: 1.5 packs/day for 30.0 years (45.0 ttl pk-yrs)    Types: Cigarettes    Start date: 01/31/1969    Quit date: 02/01/1999    Years since quitting: 24.8   Smokeless tobacco: Never  Vaping Use   Vaping status: Never Used  Substance Use Topics   Alcohol use: Not Currently    Comment: 15 years ago   Drug use: Never    Family History Family History  Problem Relation Age of Onset   Diabetes Mother    Hypertension Father     Allergies  Allergen Reactions   Penicillins Swelling   Egg Protein-Containing Drug Products Other (See Comments)     REVIEW OF SYSTEMS (Negative unless checked)  Constitutional: [] Weight loss  []   Fever  [] Chills Cardiac: [] Chest pain   [] Chest pressure   [] Palpitations   [] Shortness of breath when laying flat   [] Shortness of breath with exertion. Vascular:  [] Pain in legs with walking   [x] Pain in legs with standing  [] History of DVT   [] Phlebitis   [x] Swelling in legs   [] Varicose veins   [] Non-healing ulcers Pulmonary:   [] Uses home oxygen   [] Productive cough   [] Hemoptysis   [] Wheeze  [] COPD   [] Asthma Neurologic:  [] Dizziness   [] Seizures   [] History of stroke   [] History of TIA  [] Aphasia   [] Vissual changes   [] Weakness or numbness in arm   [] Weakness or  numbness in leg Musculoskeletal:   [] Joint swelling   [] Joint pain   [] Low back pain Hematologic:  [] Easy bruising  [] Easy bleeding   [] Hypercoagulable state   [] Anemic Gastrointestinal:  [] Diarrhea   [] Vomiting  [] Gastroesophageal reflux/heartburn   [] Difficulty swallowing. Genitourinary:  [] Chronic kidney disease   [] Difficult urination  [] Frequent urination   [] Blood in urine Skin:  [] Rashes   [] Ulcers  Psychological:  [] History of anxiety   []  History of major depression.  Physical Examination  There were no vitals filed for this visit. There is no height or weight on file to calculate BMI. Gen: WD/WN, NAD Head: [place]/AT, No temporalis wasting.  Ear/Nose/Throat: Hearing grossly intact, nares w/o erythema or drainage, pinna without lesions Eyes: PER, EOMI, sclera nonicteric.  Neck: Supple, no gross masses.  No JVD.  Pulmonary:  Good air movement, no audible wheezing, no use of accessory muscles.  Cardiac: RRR, precordium not hyperdynamic. Vascular:  scattered varicosities present bilaterally.  Profound venous stasis changes to the legs bilaterally the left leg has severe cobblestoning and rather marked skin changes.  There is now an excoriated superficial area of the left lateral ankle with odor.  3-4+ soft pitting edema, CEAP C4sEpAsPr  Vessel Right Left  Radial Palpable Palpable  Gastrointestinal: soft, non-distended. No guarding/no peritoneal signs.  Musculoskeletal: M/S 5/5 throughout.  No deformity.  Neurologic: CN 2-12 intact. Pain and light touch intact in extremities.  Symmetrical.  Speech is fluent. Motor exam as listed above. Psychiatric: Judgment intact, Mood & affect appropriate for pt's clinical situation. Dermatologic: Venous rashes no ulcers noted.  No changes consistent with cellulitis. Lymph : No lichenification or skin changes of chronic lymphedema.  CBC Lab Results  Component Value Date   WBC 10.7 (H) 08/21/2023   HGB 11.4 (L) 08/21/2023   HCT 37.0 (L) 08/21/2023    MCV 88.1 08/21/2023   PLT 243 08/21/2023    BMET    Component Value Date/Time   NA 135 01/31/2018 1628   K 3.9 01/31/2018 1628   CL 99 01/31/2018 1628   CO2 27 01/31/2018 1628   GLUCOSE 171 (H) 01/31/2018 1628   BUN 17 01/31/2018 1628   CREATININE 1.51 (H) 01/31/2018 1628   CALCIUM 8.9 01/31/2018 1628   GFRNONAA 45 (L) 01/31/2018 1628   GFRAA 53 (L) 01/31/2018 1628   CrCl cannot be calculated (Patient's most recent lab result is older than the maximum 21 days allowed.).  COAG Lab Results  Component Value Date   INR 1.2 11/26/2020    Radiology No results found.   Assessment/Plan 1. Lymphedema (Primary) No surgery or intervention at this point in time.    I have had a long discussion with the patient regarding venous insufficiency and why it  causes symptoms, specifically venous ulceration. I have discussed with the patient the chronic  skin changes that accompany venous insufficiency and the long term sequela such as infection and recurring  ulceration.  Patient will be placed in [employer] which will be changed weekly drainage permitting.  In addition, behavioral modification including several periods of elevation of the lower extremities during the day will be continued. Achieving a position with the ankles at heart level was stressed to the patient  The patient is instructed to begin routine exercise, especially walking on a daily basis  In the future the patient can be assessed for graduated compression stockings or wraps as well as a Lymph Pump once the ulcers are healed.  2. Chronic venous insufficiency No surgery or intervention at this point in time.    I have had a long discussion with the patient regarding venous insufficiency and why it  causes symptoms, specifically venous ulceration. I have discussed with the patient the chronic skin changes that accompany venous insufficiency and the long term sequela such as infection and recurring  ulceration.  Patient  will be placed in [employer] which will be changed weekly drainage permitting.  In addition, behavioral modification including several periods of elevation of the lower extremities during the day will be continued. Achieving a position with the ankles at heart level was stressed to the patient  The patient is instructed to begin routine exercise, especially walking on a daily basis  In the future the patient can be assessed for graduated compression stockings or wraps as well as a Lymph Pump once the ulcers are healed.  3. PAD (peripheral artery disease) Recommend:  I do not find evidence of life style limiting vascular disease. The patient specifically denies life style limitation.  Previous noninvasive studies including ABI's of the legs do not identify critical vascular problems.  The patient should continue walking and begin a more formal exercise program. The patient should continue his antiplatelet therapy and aggressive treatment of the lipid abnormalities.  The patient is instructed to call the office if there is a significant change in the lower extremity symptoms, particularly if a wound develops or there is an abrupt increase in leg pain.   4. Essential hypertension Continue antihypertensive medications as already ordered, these medications have been reviewed and there are no changes at this time.  5. Mixed hyperlipidemia Continue statin as ordered and reviewed, no changes at this time d   Cordella Shawl, MD  12/20/2023 4:08 PM

## 2023-12-21 ENCOUNTER — Ambulatory Visit (INDEPENDENT_AMBULATORY_CARE_PROVIDER_SITE_OTHER): Admitting: Vascular Surgery

## 2023-12-21 ENCOUNTER — Encounter (INDEPENDENT_AMBULATORY_CARE_PROVIDER_SITE_OTHER): Payer: Self-pay | Admitting: Vascular Surgery

## 2023-12-21 VITALS — BP 131/66 | HR 68 | Resp 18 | Wt 255.6 lb

## 2023-12-21 DIAGNOSIS — I890 Lymphedema, not elsewhere classified: Secondary | ICD-10-CM | POA: Diagnosis not present

## 2023-12-21 DIAGNOSIS — I739 Peripheral vascular disease, unspecified: Secondary | ICD-10-CM

## 2023-12-21 DIAGNOSIS — E782 Mixed hyperlipidemia: Secondary | ICD-10-CM

## 2023-12-21 DIAGNOSIS — I10 Essential (primary) hypertension: Secondary | ICD-10-CM

## 2023-12-21 DIAGNOSIS — I872 Venous insufficiency (chronic) (peripheral): Secondary | ICD-10-CM

## 2023-12-28 ENCOUNTER — Encounter (INDEPENDENT_AMBULATORY_CARE_PROVIDER_SITE_OTHER): Payer: Self-pay | Admitting: Nurse Practitioner

## 2023-12-28 ENCOUNTER — Encounter (INDEPENDENT_AMBULATORY_CARE_PROVIDER_SITE_OTHER): Payer: Self-pay

## 2023-12-28 ENCOUNTER — Ambulatory Visit (INDEPENDENT_AMBULATORY_CARE_PROVIDER_SITE_OTHER): Admitting: Nurse Practitioner

## 2023-12-28 VITALS — BP 160/75 | HR 71 | Resp 18

## 2023-12-28 DIAGNOSIS — I890 Lymphedema, not elsewhere classified: Secondary | ICD-10-CM | POA: Diagnosis not present

## 2023-12-28 NOTE — Progress Notes (Signed)
 History of Present Illness  There is no documented history at this time  Assessments & Plan   There are no diagnoses linked to this encounter.    Additional instructions  Subjective:  Patient presents with venous ulcer of the Left lower extremity.    Procedure:  3 layer unna wrap was placed Left lower extremity.   Plan:   Follow up in one week.

## 2024-01-02 ENCOUNTER — Ambulatory Visit (INDEPENDENT_AMBULATORY_CARE_PROVIDER_SITE_OTHER): Admitting: Nurse Practitioner

## 2024-01-02 ENCOUNTER — Encounter (INDEPENDENT_AMBULATORY_CARE_PROVIDER_SITE_OTHER): Payer: Self-pay

## 2024-01-02 VITALS — BP 152/72 | HR 76 | Resp 18 | Wt 253.6 lb

## 2024-01-02 DIAGNOSIS — I890 Lymphedema, not elsewhere classified: Secondary | ICD-10-CM | POA: Diagnosis not present

## 2024-01-02 NOTE — Progress Notes (Signed)
 History of Present Illness  There is no documented history at this time  Assessments & Plan   There are no diagnoses linked to this encounter.    Additional instructions  Subjective:  Patient presents with venous ulcer of the Left lower extremity.    Procedure:  3 layer unna wrap was placed Left lower extremity.   Plan:   Follow up in one week.

## 2024-01-07 ENCOUNTER — Encounter (INDEPENDENT_AMBULATORY_CARE_PROVIDER_SITE_OTHER): Payer: Self-pay | Admitting: Nurse Practitioner

## 2024-01-10 ENCOUNTER — Ambulatory Visit (INDEPENDENT_AMBULATORY_CARE_PROVIDER_SITE_OTHER): Admitting: Nurse Practitioner

## 2024-01-10 ENCOUNTER — Encounter (INDEPENDENT_AMBULATORY_CARE_PROVIDER_SITE_OTHER): Payer: Self-pay

## 2024-01-10 VITALS — BP 136/69 | HR 65 | Resp 18 | Wt 253.0 lb

## 2024-01-10 DIAGNOSIS — I890 Lymphedema, not elsewhere classified: Secondary | ICD-10-CM

## 2024-01-10 NOTE — Progress Notes (Signed)
 History of Present Illness  There is no documented history at this time  Assessments & Plan   There are no diagnoses linked to this encounter.    Additional instructions  Subjective:  Patient presents with venous ulcer of the Left lower extremity.    Procedure:  3 layer unna wrap was placed Left lower extremity.   Plan:   Follow up in one week.

## 2024-01-14 ENCOUNTER — Encounter (INDEPENDENT_AMBULATORY_CARE_PROVIDER_SITE_OTHER): Payer: Self-pay | Admitting: Nurse Practitioner

## 2024-01-18 ENCOUNTER — Other Ambulatory Visit

## 2024-01-18 ENCOUNTER — Ambulatory Visit (INDEPENDENT_AMBULATORY_CARE_PROVIDER_SITE_OTHER): Admitting: Nurse Practitioner

## 2024-01-18 ENCOUNTER — Encounter (INDEPENDENT_AMBULATORY_CARE_PROVIDER_SITE_OTHER): Payer: Self-pay | Admitting: Nurse Practitioner

## 2024-01-18 VITALS — BP 115/64 | HR 64 | Resp 17 | Ht 66.0 in | Wt 246.4 lb

## 2024-01-18 DIAGNOSIS — I890 Lymphedema, not elsewhere classified: Secondary | ICD-10-CM

## 2024-01-18 NOTE — Progress Notes (Unsigned)
 History of Present Illness  There is no documented history at this time  Assessments & Plan   There are no diagnoses linked to this encounter.    Additional instructions  Subjective:  Patient presents with venous ulcer of the Left lower extremity.    Procedure:  3 layer unna wrap was placed Left lower extremity.   Plan:   Follow up in one week.

## 2024-01-23 ENCOUNTER — Encounter (INDEPENDENT_AMBULATORY_CARE_PROVIDER_SITE_OTHER): Payer: Self-pay | Admitting: Nurse Practitioner

## 2024-01-23 NOTE — Progress Notes (Signed)
 "        MRN : [id number]  [name] is a 79 y.o. (1945-07-07) male who presents with chief complaint of legs hurt and swell.  History of Present Illness:  The patient returns to the office for followup evaluation regarding leg swelling.  The swelling has persisted but with the combination of a lymphedema pump and Unna wraps is under much, much better controlled. The pain associated with swelling is is now gone.    The ulceration of the left lateral ankle has healed.    The patient denies problems with the pump, noting it is working well and the leggings are in good condition.   Patient stated the lymph pump has been helpful with the treatment of the lymphedema.    ABI's dated 05/02/2023 Rt=[place] (TBI=0.76; Triphasic) and Lt=[place] (TBI=0.71; Triphasic).   Active Medications[1]  Past Medical History:  Diagnosis Date   Cancer (HCC)    COPD (chronic obstructive pulmonary disease) (HCC)    Diabetes mellitus without complication (HCC)    Hyperlipidemia    Hypertension    Left thyroid  nodule    Prostate CA (HCC)    Renal insufficiency     Past Surgical History:  Procedure Laterality Date   CARDIOVERSION N/A 11/26/2020   Procedure: CARDIOVERSION;  Surgeon: Hester Wolm PARAS, MD;  Location: [facility] ORS;  Service: Cardiovascular;  Laterality: N/A;   COLONOSCOPY WITH PROPOFOL      COLONOSCOPY WITH PROPOFOL  N/A 08/16/2018   Procedure: COLONOSCOPY WITH PROPOFOL ;  Surgeon: Gaylyn Gladis PENNER, MD;  Location: [facility] ENDOSCOPY;  Service: Endoscopy;  Laterality: N/A;   COLONOSCOPY WITH PROPOFOL  N/A 12/07/2021   Procedure: COLONOSCOPY WITH PROPOFOL ;  Surgeon: Maryruth Ole DASEN, MD;  Location: [facility] ENDOSCOPY;  Service: Endoscopy;  Laterality: N/A;   ESOPHAGOGASTRODUODENOSCOPY (EGD) WITH PROPOFOL  N/A 12/07/2021   Procedure: ESOPHAGOGASTRODUODENOSCOPY (EGD) WITH PROPOFOL ;  Surgeon: Maryruth Ole DASEN, MD;  Location: [facility] ENDOSCOPY;  Service: Endoscopy;  Laterality: N/A;   insertion of radiation  seeds     TEE WITHOUT CARDIOVERSION N/A 11/26/2020   Procedure: TRANSESOPHAGEAL ECHOCARDIOGRAM (TEE);  Surgeon: Hester Wolm PARAS, MD;  Location: [facility] ORS;  Service: Cardiovascular;  Laterality: N/A;    Social History Social History[2]  Family History Family History  Problem Relation Age of Onset   Diabetes Mother    Hypertension Father     Allergies[3]   REVIEW OF SYSTEMS (Negative unless checked)  Constitutional: [] Weight loss  [] Fever  [] Chills Cardiac: [] Chest pain   [] Chest pressure   [] Palpitations   [] Shortness of breath when laying flat   [] Shortness of breath with exertion. Vascular:  [] Pain in legs with walking   [x] Pain in legs at rest  [] History of DVT   [] Phlebitis   [x] Swelling in legs   [] Varicose veins   [] Non-healing ulcers Pulmonary:   [] Uses home oxygen   [] Productive cough   [] Hemoptysis   [] Wheeze  [] COPD   [] Asthma Neurologic:  [] Dizziness   [] Seizures   [] History of stroke   [] History of TIA  [] Aphasia   [] Vissual changes   [] Weakness or numbness in arm   [] Weakness or numbness in leg Musculoskeletal:   [] Joint swelling   [] Joint pain   [] Low back pain Hematologic:  [] Easy bruising  [] Easy bleeding   [] Hypercoagulable state   [] Anemic Gastrointestinal:  [] Diarrhea   [] Vomiting  [] Gastroesophageal reflux/heartburn   [] Difficulty swallowing. Genitourinary:  [] Chronic kidney disease   [] Difficult urination  [] Frequent urination   [] Blood in urine Skin:  [] Rashes   [] Ulcers  Psychological:  []   History of anxiety   []  History of major depression.  Physical Examination  There were no vitals filed for this visit. There is no height or weight on file to calculate BMI. Gen: WD/WN, NAD Head: [place]/AT, No temporalis wasting.  Ear/Nose/Throat: Hearing grossly intact, nares w/o erythema or drainage, pinna without lesions Eyes: PER, EOMI, sclera nonicteric.  Neck: Supple, no gross masses.  No JVD.  Pulmonary:  Good air movement, no audible wheezing, no use of accessory  muscles.  Cardiac: RRR, precordium not hyperdynamic. Vascular:  scattered varicosities present bilaterally.  Moderate venous stasis changes to the legs bilaterally.  3+ soft pitting edema left greater than right. CEAP C4sEpAsPr   Vessel Right Left  Radial Palpable Palpable  Gastrointestinal: soft, non-distended. No guarding/no peritoneal signs.  Musculoskeletal: M/S 5/5 throughout.  No deformity.  Neurologic: CN 2-12 intact. Pain and light touch intact in extremities.  Symmetrical.  Speech is fluent. Motor exam as listed above. Psychiatric: Judgment intact, Mood & affect appropriate for pt's clinical situation. Dermatologic: Venous rashes no ulcers noted.  No changes consistent with cellulitis. Lymph : No lichenification or skin changes of chronic lymphedema.  CBC Lab Results  Component Value Date   WBC 10.7 (H) 08/21/2023   HGB 11.4 (L) 08/21/2023   HCT 37.0 (L) 08/21/2023   MCV 88.1 08/21/2023   PLT 243 08/21/2023    BMET    Component Value Date/Time   NA 135 01/31/2018 1628   K 3.9 01/31/2018 1628   CL 99 01/31/2018 1628   CO2 27 01/31/2018 1628   GLUCOSE 171 (H) 01/31/2018 1628   BUN 17 01/31/2018 1628   CREATININE 1.51 (H) 01/31/2018 1628   CALCIUM 8.9 01/31/2018 1628   GFRNONAA 45 (L) 01/31/2018 1628   GFRAA 53 (L) 01/31/2018 1628   CrCl cannot be calculated (Patient's most recent lab result is older than the maximum 21 days allowed.).  COAG Lab Results  Component Value Date   INR 1.2 11/26/2020    Radiology No results found.   Assessment/Plan 1. Venous ulcer (HCC) (Primary) Venous ulcer is now healed and once again we have good control of his lymphedema.  He will transition from [employer] boots to compression wraps.  2. Chronic venous insufficiency Recommend:  No surgery or intervention at this point in time.    I have reviewed my discussion with the patient regarding lymphedema and why it  causes symptoms.  Patient will continue wearing graduated  compression on a daily basis. The patient should put the compression on first thing in the morning and removing them in the evening. The patient should not sleep in the compression.   In addition, behavioral modification throughout the day will be continued.  This will include frequent elevation (such as in a recliner), use of over the counter pain medications as needed and exercise such as walking.  The systemic causes for chronic edema such as liver, kidney and cardiac etiologies does not appear to have significant changed over the past year.    The patient will continue aggressive use of the  lymph pump.  This will continue to improve the edema control and prevent sequela such as ulcers and infections.   The patient will follow-up with me on an annual basis.   3. PAD (peripheral artery disease) Recommend:  I do not find evidence of life style limiting vascular disease. The patient specifically denies life style limitation.  Previous noninvasive studies including ABI's of the legs do not identify critical vascular problems.  The  patient should continue walking and begin a more formal exercise program. The patient should continue his antiplatelet therapy and aggressive treatment of the lipid abnormalities.  The patient is instructed to call the office if there is a significant change in the lower extremity symptoms, particularly if a wound develops or there is an abrupt increase in leg pain.  4. Essential hypertension Continue antihypertensive medications as already ordered, these medications have been reviewed and there are no changes at this time.  5. Chronic obstructive pulmonary disease, unspecified COPD type (HCC) Continue pulmonary medications and aerosols as already ordered, these medications have been reviewed and there are no changes at this time.     Cordella Shawl, MD  01/23/2024 2:44 PM      [1]  No outpatient medications have been marked as taking for the 01/25/24  encounter (Appointment) with Shawl, Cordella MATSU, MD.  [2]  Social History Tobacco Use   Smoking status: Former    Current packs/day: 0.00    Average packs/day: 1.5 packs/day for 30.0 years (45.0 ttl pk-yrs)    Types: Cigarettes    Start date: 01/31/1969    Quit date: 02/01/1999    Years since quitting: 24.9   Smokeless tobacco: Never  Vaping Use   Vaping status: Never Used  Substance Use Topics   Alcohol use: Not Currently    Comment: 15 years ago   Drug use: Never  [3]  Allergies Allergen Reactions   Penicillins Swelling   Egg Protein-Containing Drug Products Other (See Comments)   "

## 2024-01-25 ENCOUNTER — Encounter (INDEPENDENT_AMBULATORY_CARE_PROVIDER_SITE_OTHER): Payer: Self-pay | Admitting: Vascular Surgery

## 2024-01-25 ENCOUNTER — Ambulatory Visit (INDEPENDENT_AMBULATORY_CARE_PROVIDER_SITE_OTHER): Admitting: Vascular Surgery

## 2024-01-25 VITALS — BP 108/55 | HR 76 | Resp 17 | Ht 66.0 in | Wt 248.0 lb

## 2024-01-25 DIAGNOSIS — I872 Venous insufficiency (chronic) (peripheral): Secondary | ICD-10-CM

## 2024-01-25 DIAGNOSIS — I10 Essential (primary) hypertension: Secondary | ICD-10-CM

## 2024-01-25 DIAGNOSIS — I739 Peripheral vascular disease, unspecified: Secondary | ICD-10-CM | POA: Diagnosis not present

## 2024-01-25 DIAGNOSIS — J449 Chronic obstructive pulmonary disease, unspecified: Secondary | ICD-10-CM | POA: Diagnosis not present

## 2024-01-25 DIAGNOSIS — I83009 Varicose veins of unspecified lower extremity with ulcer of unspecified site: Secondary | ICD-10-CM | POA: Diagnosis not present

## 2024-01-25 DIAGNOSIS — L97909 Non-pressure chronic ulcer of unspecified part of unspecified lower leg with unspecified severity: Secondary | ICD-10-CM | POA: Diagnosis not present

## 2024-01-28 ENCOUNTER — Encounter (INDEPENDENT_AMBULATORY_CARE_PROVIDER_SITE_OTHER): Payer: Self-pay | Admitting: Vascular Surgery

## 2024-02-22 ENCOUNTER — Other Ambulatory Visit

## 2024-02-29 ENCOUNTER — Ambulatory Visit: Admitting: Oncology

## 2024-03-01 ENCOUNTER — Other Ambulatory Visit: Payer: Medicare Other

## 2024-03-04 ENCOUNTER — Ambulatory Visit: Admitting: Urology

## 2024-03-05 ENCOUNTER — Ambulatory Visit: Payer: Medicare Other | Admitting: Urology

## 2024-04-11 ENCOUNTER — Other Ambulatory Visit

## 2024-04-25 ENCOUNTER — Ambulatory Visit: Admitting: Urology

## 2024-07-25 ENCOUNTER — Ambulatory Visit (INDEPENDENT_AMBULATORY_CARE_PROVIDER_SITE_OTHER): Admitting: Vascular Surgery
